# Patient Record
Sex: Female | Born: 1980 | Race: White | Hispanic: No | Marital: Single | Smoking: Never smoker
Health system: Southern US, Community
[De-identification: ages and names within clinical notes are randomized; demographics above are authoritative.]

## PROBLEM LIST (undated history)

## (undated) DIAGNOSIS — Z972 Presence of dental prosthetic device (complete) (partial): Secondary | ICD-10-CM

## (undated) DIAGNOSIS — I10 Essential (primary) hypertension: Secondary | ICD-10-CM

## (undated) DIAGNOSIS — F410 Panic disorder [episodic paroxysmal anxiety] without agoraphobia: Secondary | ICD-10-CM

## (undated) DIAGNOSIS — Z973 Presence of spectacles and contact lenses: Secondary | ICD-10-CM

## (undated) DIAGNOSIS — Z9889 Other specified postprocedural states: Secondary | ICD-10-CM

## (undated) DIAGNOSIS — R112 Nausea with vomiting, unspecified: Secondary | ICD-10-CM

## (undated) DIAGNOSIS — T753XXA Motion sickness, initial encounter: Secondary | ICD-10-CM

## (undated) DIAGNOSIS — F419 Anxiety disorder, unspecified: Secondary | ICD-10-CM

---
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
## 2004-03-27 ENCOUNTER — Inpatient Hospital Stay (HOSPITAL_COMMUNITY): Admission: AD | Admit: 2004-03-27 | Discharge: 2004-03-30 | Payer: Self-pay | Admitting: Gynecology

## 2006-12-20 ENCOUNTER — Emergency Department: Payer: Self-pay | Admitting: Emergency Medicine

## 2007-06-06 ENCOUNTER — Emergency Department: Payer: Self-pay | Admitting: Emergency Medicine

## 2007-11-17 ENCOUNTER — Emergency Department: Payer: Self-pay | Admitting: Emergency Medicine

## 2008-05-04 ENCOUNTER — Emergency Department: Payer: Self-pay | Admitting: Emergency Medicine

## 2008-07-03 ENCOUNTER — Ambulatory Visit: Payer: Self-pay | Admitting: Internal Medicine

## 2008-12-23 ENCOUNTER — Emergency Department: Payer: Self-pay | Admitting: Emergency Medicine

## 2011-02-17 ENCOUNTER — Emergency Department: Payer: Self-pay | Admitting: Unknown Physician Specialty

## 2011-06-05 ENCOUNTER — Emergency Department: Payer: Self-pay | Admitting: *Deleted

## 2011-06-24 ENCOUNTER — Emergency Department: Payer: Self-pay | Admitting: *Deleted

## 2011-08-19 ENCOUNTER — Emergency Department: Payer: Self-pay | Admitting: Internal Medicine

## 2012-05-02 ENCOUNTER — Emergency Department: Payer: Self-pay | Admitting: Emergency Medicine

## 2012-05-04 LAB — BETA STREP CULTURE(ARMC)

## 2012-10-05 ENCOUNTER — Emergency Department: Payer: Self-pay | Admitting: Emergency Medicine

## 2012-10-05 LAB — WET PREP, GENITAL

## 2012-10-05 LAB — URINALYSIS, COMPLETE
Bilirubin,UR: NEGATIVE
Ketone: NEGATIVE
Leukocyte Esterase: NEGATIVE
Nitrite: NEGATIVE
Ph: 7 (ref 4.5–8.0)
Protein: NEGATIVE
RBC,UR: NONE SEEN /HPF (ref 0–5)
WBC UR: <1 /HPF (ref 0–5)

## 2012-10-05 LAB — CBC
HCT: 38.5 % (ref 35.0–47.0)
HGB: 13.1 g/dL (ref 12.0–16.0)
MCH: 28.9 pg (ref 26.0–34.0)
MCHC: 34 g/dL (ref 32.0–36.0)
MCV: 85 fL (ref 80–100)
RBC: 4.52 10*6/uL (ref 3.80–5.20)
WBC: 8.1 10*3/uL (ref 3.6–11.0)

## 2012-10-05 LAB — HCG, QUANTITATIVE, PREGNANCY: Beta Hcg, Quant.: 3684 m[IU]/mL — ABNORMAL HIGH

## 2013-02-22 ENCOUNTER — Ambulatory Visit: Payer: Self-pay | Admitting: Physician Assistant

## 2013-05-05 ENCOUNTER — Ambulatory Visit: Payer: Self-pay | Admitting: Physician Assistant

## 2014-11-11 ENCOUNTER — Encounter: Payer: Self-pay | Admitting: Gynecology

## 2014-11-11 ENCOUNTER — Ambulatory Visit
Admission: EM | Admit: 2014-11-11 | Discharge: 2014-11-11 | Disposition: A | Discharge status: Home / Self Care | Payer: Medicaid Other | Attending: Family Medicine | Admitting: Family Medicine

## 2014-11-11 DIAGNOSIS — F419 Anxiety disorder, unspecified: Secondary | ICD-10-CM | POA: Insufficient documentation

## 2014-11-11 DIAGNOSIS — Z79899 Other long term (current) drug therapy: Secondary | ICD-10-CM | POA: Diagnosis not present

## 2014-11-11 DIAGNOSIS — J029 Acute pharyngitis, unspecified: Secondary | ICD-10-CM | POA: Diagnosis present

## 2014-11-11 DIAGNOSIS — J039 Acute tonsillitis, unspecified: Secondary | ICD-10-CM | POA: Diagnosis not present

## 2014-11-11 HISTORY — DX: Anxiety disorder, unspecified: F41.9

## 2014-11-11 LAB — RAPID STREP SCREEN (MED CTR MEBANE ONLY): STREPTOCOCCUS, GROUP A SCREEN (DIRECT): NEGATIVE

## 2014-11-11 MED ORDER — AZITHROMYCIN 250 MG PO TABS: 250.0000 mg | ORAL_TABLET | Freq: Every day | ORAL | Status: DC

## 2014-11-11 NOTE — ED Notes (Signed)
Patient c/o sore throat. 

## 2014-11-11 NOTE — Discharge Instructions (Signed)

## 2014-11-11 NOTE — ED Provider Notes (Addendum)
CSN: [id number]     Arrival date & time 11/11/14  1420 History   First MD Initiated Contact with Patient 11/11/14 1503     Chief Complaint  Patient presents with  . Sore Throat   (Consider location/radiation/quality/duration/timing/severity/associated sxs/prior Treatment) HPI Comments: Caucasian female; daughter sick with similar symptoms 89F fever today taking tylenol and motrin OTC alternating.  Sore throat x 4 days.  Clear to yellow now green nasal discharge.  Frontal headache.  Nephew with hand foot and mouth disease and children played together day prior to rash appeared.  Patient reported she has scrubbed pus off right tonsil with qtip.  Patient is a 34 y.o. female presenting with pharyngitis. The history is provided by the patient.  Sore Throat This is a new problem. The current episode started more than 2 days ago. The problem occurs constantly. The problem has been gradually worsening. Pertinent negatives include no chest pain, no abdominal pain, no headaches and no shortness of breath. The symptoms are aggravated by eating and swallowing. Nothing relieves the symptoms. She has tried acetaminophen, rest, food and water for the symptoms. The treatment provided mild relief.    Past Medical History  Diagnosis Date  . Anxiety    Past Surgical History  Procedure Laterality Date  . Cesarean section      x 2   History reviewed. No pertinent family history. History  Substance Use Topics  . Smoking status: Never Smoker   . Smokeless tobacco: Not on file  . Alcohol Use: No   OB History    No data available     Review of Systems  Constitutional: Positive for fever. Negative for chills, diaphoresis, activity change, appetite change and fatigue.  HENT: Positive for congestion, rhinorrhea, sinus pressure and sore throat. Negative for dental problem, drooling, ear discharge, ear pain, facial swelling, hearing loss, mouth sores, nosebleeds, postnasal drip, sneezing, tinnitus, trouble  swallowing and voice change.   Eyes: Negative for photophobia, pain, discharge, redness and itching.  Respiratory: Negative for cough, shortness of breath, wheezing and stridor.   Cardiovascular: Negative for chest pain, palpitations and leg swelling.  Gastrointestinal: Negative for nausea, vomiting, abdominal pain, diarrhea, constipation, blood in stool, abdominal distention, anal bleeding and rectal pain.  Endocrine: Negative for cold intolerance and heat intolerance.  Genitourinary: Negative for hematuria, flank pain and difficulty urinating.  Musculoskeletal: Negative for myalgias, back pain, joint swelling, arthralgias, gait problem, neck pain and neck stiffness.  Skin: Negative for color change, pallor, rash and wound.  Allergic/Immunologic: Negative for environmental allergies and food allergies.  Neurological: Negative for dizziness, tremors, syncope, facial asymmetry, speech difficulty, weakness, light-headedness, numbness and headaches.  Hematological: Negative for adenopathy. Does not bruise/bleed easily.  Psychiatric/Behavioral: Negative for behavioral problems, confusion, sleep disturbance and agitation.    Allergies  Amoxicillin and Penicillins  Home Medications   Prior to Admission medications   Medication Sig Start Date End Date Taking? Authorizing Provider  hydrochlorothiazide (HYDRODIURIL) 25 MG tablet Take 25 mg by mouth daily.   Yes Historical Provider, MD  PARoxetine (PAXIL) 30 MG tablet Take 30 mg by mouth daily.   Yes Historical Provider, MD  azithromycin (ZITHROMAX) 250 MG tablet Take 1 tablet (250 mg total) by mouth daily. Take first 2 tablets together, then 1 every day until finished. 11/11/14   Barbaraann Barthelina A Betancourt, NP   BP 126/91 mmHg  Pulse 88  Temp(Src) 97.4 F (36.3 C) (Tympanic)  Ht 5\' 4"  (1.626 m)  Wt 215 lb (97.523 kg)  BMI  36.89 kg/m2  SpO2 98%  LMP 11/11/2014 Physical Exam  Constitutional: She is oriented to person, place, and time. Vital signs are  normal. She appears well-developed and well-nourished. No distress.  HENT:  Head: Normocephalic and atraumatic.  Right Ear: Hearing, tympanic membrane, external ear and ear canal normal.  Left Ear: Hearing, tympanic membrane, external ear and ear canal normal.  Nose: Mucosal edema and rhinorrhea present. No nose lacerations, sinus tenderness or nasal deformity. No epistaxis. Right sinus exhibits no maxillary sinus tenderness and no frontal sinus tenderness. Left sinus exhibits no maxillary sinus tenderness and no frontal sinus tenderness.  Mouth/Throat: Uvula is midline and mucous membranes are normal. She does not have dentures. No oral lesions. No trismus in the jaw. Normal dentition. No dental abscesses, uvula swelling, lacerations or dental caries. Posterior oropharyngeal edema, posterior oropharyngeal erythema and tonsillar abscesses present. No oropharyngeal exudate.  Bilateral TMs with air fluid level clear; right tonsil with exudate bilateral tonsils with edema/erythema 2-3+/4 symmetrical; cobblestoning posterior pharynx  Eyes: Conjunctivae, EOM and lids are normal. Pupils are equal, round, and reactive to light. Right eye exhibits no discharge. Left eye exhibits no discharge. No scleral icterus.  Neck: Trachea normal and normal range of motion. Neck supple. No tracheal deviation present. No thyromegaly present.  Cardiovascular: Normal rate, regular rhythm, normal heart sounds and intact distal pulses.  Exam reveals no gallop and no friction rub.   No murmur heard. Pulmonary/Chest: Effort normal and breath sounds normal. No respiratory distress. She has no wheezes. She has no rales. She exhibits no tenderness.  Abdominal: Soft. Bowel sounds are normal. She exhibits no distension and no mass. There is no tenderness. There is no rebound and no guarding.  Musculoskeletal: Normal range of motion. She exhibits no edema or tenderness.  Lymphadenopathy:    She has no cervical adenopathy.   Neurological: She is alert and oriented to person, place, and time. Coordination normal.  Skin: Skin is warm, dry and intact. No rash noted. She is not diaphoretic. No erythema. No pallor.  Psychiatric: She has a normal mood and affect. Her speech is normal and behavior is normal. Judgment and thought content normal. Cognition and memory are normal.  Nursing note and vitals reviewed.   ED Course  Procedures (including critical care time) Labs Review Labs Reviewed  RAPID STREP SCREEN (NOT AT [facility])  CULTURE, GROUP A STREP ([facility] ONLY)    Imaging Review No results found.   MDM   1. Tonsillitis    Patient preferred po meds azithromycin as penicillin allergy.   Patient notified rapid strep negative and throat culture results will be available in 48 hours.  Exudate on tonsils will treat.  Offered dukes mouthwash patient did not want at this time.  For acute tonsillitis caused by bacteria, your healthcare provider will prescribe an antibiotic.  Marland Kitchen Do not smoke.  Marland Kitchen Avoid secondhand smoke and other air pollutants.  . Use a cool mist humidifier to add moisture to the air.  . Get plenty of rest.  . You may want to rest your throat by talking less and eating a diet that is mostly liquid or soft for a day or two.   Marland Kitchen Nonprescription throat lozenges and mouthwashes should help relieve the soreness.   . Gargling with warm saltwater and drinking warm liquids may help.  (You can make a saltwater solution by adding 1/4 teaspoon of salt to 8 ounces, or 240 mL, of warm water.)  . A nonprescription pain reliever such as  aspirin, acetaminophen, or ibuprofen may ease general aches and pains.    Discussed no sharing eating utensils or oral hygiene products.  Throw away toothbrush after one week and buy new.  No intimate oral contact x 1 week.  FOLLOW UP with clinic provider if no improvements in the next 7-10 days or worsening of symptoms, fever, dysphagia.  Patient verbalized understanding of  instructions, agreed with plan of care and had no further questions at this time.   P2:  Hand washing and diet.    Barbaraann Barthel, NP 11/11/14 4098  14 Nov 2014 1542 patient contacted via telephone and notified throat culture negative/normal.  Patient reported she is feeling better.  Patient verbalized understanding of information/instructions and had no further questions at this time.  Barbaraann Barthel, NP 11/14/14 [phone]

## 2014-11-14 LAB — CULTURE, GROUP A STREP (THRC)

## 2014-11-29 ENCOUNTER — Encounter: Payer: Self-pay | Admitting: Emergency Medicine

## 2014-11-29 ENCOUNTER — Ambulatory Visit
Admission: EM | Admit: 2014-11-29 | Discharge: 2014-11-29 | Disposition: A | Discharge status: Home / Self Care | Payer: Medicaid Other | Attending: Internal Medicine | Admitting: Internal Medicine

## 2014-11-29 DIAGNOSIS — M5431 Sciatica, right side: Secondary | ICD-10-CM | POA: Diagnosis not present

## 2014-11-29 MED ORDER — PREDNISONE 50 MG PO TABS: 50.0000 mg | ORAL_TABLET | Freq: Every day | ORAL | Status: AC

## 2014-11-29 NOTE — Discharge Instructions (Signed)
Pain in R hip/leg seems most likely to be something like sciatica. Continue ibuprofen as you are. Prescription for prednisone was sent to the [facility]. Ice maybe helpful with decreasing frequency of sharp pains. Recheck hip/leg pain, especially with knee flexion, at urgent care or with primary doctor in a week.

## 2014-11-29 NOTE — ED Notes (Signed)
Patient c/o right hip pain that goes down right leg.  Patient states that she fell on Monday.

## 2014-11-29 NOTE — ED Provider Notes (Signed)
CSN: [id number]     Arrival date & time 11/29/14  1313 History   First MD Initiated Contact with Patient 11/29/14 1356     Chief Complaint  Patient presents with  . Fall  . Hip Pain    right   HPI Patient is a 34 year old lady who was playing with her kids several days ago, tripped on a toy and started to fall. She caught herself awkwardly, and has had intermittent severe/sharp pain shooting down the outside of her right leg. No leg weakness or clumsiness, no change in bowel or bladder function, no loss of sensation. No back pain per se. She was able to walk into the urgent care independently. Sitting at rest, she has mild discomfort, maybe a 1 or 2. Changes in position trigger this shooting pain.  Past Medical History  Diagnosis Date  . Anxiety    Past Surgical History  Procedure Laterality Date  . Cesarean section      x 2   History reviewed. No pertinent family history. History  Substance Use Topics  . Smoking status: Never Smoker   . Smokeless tobacco: Not on file  . Alcohol Use: No   OB History    No data available     Review of Systems  All other systems reviewed and are negative.   Allergies  Amoxicillin and Penicillins  Home Medications   Prior to Admission medications   Medication Sig Start Date End Date Taking? Authorizing Provider  hydrochlorothiazide (HYDRODIURIL) 25 MG tablet Take 25 mg by mouth daily.    Historical Provider, MD  PARoxetine (PAXIL) 30 MG tablet Take 30 mg by mouth daily.    Historical Provider, MD          BP 127/77 mmHg  Pulse 75  Temp(Src) 98 F (36.7 C) (Tympanic)  Resp 16  Ht 5\' 4"  (1.626 m)  Wt 215 lb (97.523 kg)  BMI 36.89 kg/m2  SpO2 98%  LMP 11/11/2014 Physical Exam  Constitutional: She is oriented to person, place, and time. No distress.  Alert, nicely groomed  HENT:  Head: Atraumatic.  Eyes:  Conjugate gaze, no eye redness/drainage  Neck: Neck supple.  Cardiovascular: Normal rate.   Pulmonary/Chest: No  respiratory distress.  Abdominal: She exhibits no distension.  Musculoskeletal: Normal range of motion.       Legs: No leg swelling No tenderness to palpation in the low back, no spinal tenderness. No right trochanteric tenderness. Patient was able to walk into the urgent care independently, but changes in position particularly hip flexion and extension are slow with intermittent painfulness. Resisted knee flexion is particularly painful on the right.  Neurological: She is alert and oriented to person, place, and time.  Skin: Skin is warm and dry.  No cyanosis  Nursing note and vitals reviewed.   ED Course  Procedures   MDM   1. Sciatica neuralgia, right    continue ibuprofen. Prescription for a pulse of prednisone was sent to the [facility] pharmacy. Recheck hip/leg pain, especially with knee flexion, with PCP or the urgent care in a week. Consider imaging versus physical therapy depending on level of improvement.    Eustace Moore, MD 11/29/14 1420

## 2015-10-19 ENCOUNTER — Encounter: Payer: Self-pay | Admitting: Emergency Medicine

## 2015-10-19 ENCOUNTER — Emergency Department: Payer: Medicaid Other

## 2015-10-19 ENCOUNTER — Emergency Department
Admission: EM | Admit: 2015-10-19 | Discharge: 2015-10-19 | Disposition: A | Discharge status: Home / Self Care | Payer: Medicaid Other | Attending: Emergency Medicine | Admitting: Emergency Medicine

## 2015-10-19 DIAGNOSIS — F419 Anxiety disorder, unspecified: Secondary | ICD-10-CM | POA: Insufficient documentation

## 2015-10-19 DIAGNOSIS — I10 Essential (primary) hypertension: Secondary | ICD-10-CM | POA: Insufficient documentation

## 2015-10-19 DIAGNOSIS — Z880 Allergy status to penicillin: Secondary | ICD-10-CM | POA: Diagnosis not present

## 2015-10-19 DIAGNOSIS — R079 Chest pain, unspecified: Secondary | ICD-10-CM | POA: Diagnosis present

## 2015-10-19 HISTORY — DX: Essential (primary) hypertension: I10

## 2015-10-19 LAB — COMPREHENSIVE METABOLIC PANEL
ALK PHOS: 80 U/L (ref 38–126)
ALT: 9 U/L — AB (ref 14–54)
AST: 24 U/L (ref 15–41)
Albumin: 4 g/dL (ref 3.5–5.0)
Anion gap: 10 (ref 5–15)
BUN: 12 mg/dL (ref 6–20)
CALCIUM: 8.9 mg/dL (ref 8.9–10.3)
CHLORIDE: 98 mmol/L — AB (ref 101–111)
CO2: 28 mmol/L (ref 22–32)
CREATININE: 0.65 mg/dL (ref 0.44–1.00)
GFR calc Af Amer: >60 mL/min (ref >60)
Glucose, Bld: 116 mg/dL — ABNORMAL HIGH (ref 65–99)
Potassium: 2.8 mmol/L — CL (ref 3.5–5.1)
Sodium: 136 mmol/L (ref 135–145)
Total Bilirubin: <0.1 mg/dL — ABNORMAL LOW (ref 0.3–1.2)
Total Protein: 7.6 g/dL (ref 6.5–8.1)

## 2015-10-19 LAB — CBC
HCT: 35.2 % (ref 35.0–47.0)
Hemoglobin: 12.1 g/dL (ref 12.0–16.0)
MCH: 27.9 pg (ref 26.0–34.0)
MCHC: 34.3 g/dL (ref 32.0–36.0)
MCV: 81.3 fL (ref 80.0–100.0)
Platelets: 366 10*3/uL (ref 150–440)
RBC: 4.33 MIL/uL (ref 3.80–5.20)
RDW: 14.1 % (ref 11.5–14.5)
WBC: 10.7 10*3/uL (ref 3.6–11.0)

## 2015-10-19 LAB — TROPONIN I

## 2015-10-19 MED ORDER — POTASSIUM CHLORIDE ER 10 MEQ PO TBCR: 10.0000 meq | EXTENDED_RELEASE_TABLET | Freq: Every day | ORAL | Status: DC

## 2015-10-19 MED ORDER — LORAZEPAM 2 MG PO TABS
2.0000 mg | ORAL_TABLET | Freq: Once | ORAL | Status: AC
  Administered 2015-10-19: 2 mg via ORAL
  Filled 2015-10-19: qty 1

## 2015-10-19 MED ORDER — LORAZEPAM 1 MG PO TABS: 1.0000 mg | ORAL_TABLET | Freq: Two times a day (BID) | ORAL | Status: DC | PRN

## 2015-10-19 MED ORDER — POTASSIUM CHLORIDE CRYS ER 20 MEQ PO TBCR
20.0000 meq | EXTENDED_RELEASE_TABLET | Freq: Once | ORAL | Status: AC
  Administered 2015-10-19: 20 meq via ORAL
  Filled 2015-10-19: qty 1

## 2015-10-19 NOTE — ED Notes (Signed)
When questioned during triage has sensation of heart pounding. When holds one hand over each eye can see nurses hand well. PERL. Grips and leg strength equal.

## 2015-10-19 NOTE — ED Notes (Signed)
States has had chest pain x 20 minutes, feeling of heart pounding. States on way to ER noted intermittent L eye vision changes. Nausea and vomiting since this am.

## 2015-10-19 NOTE — ED Provider Notes (Signed)
Time Seen: Approximately 1630  I have reviewed the triage notes  Chief Complaint: Chest Pain   History of Present Illness: Monique Horne is a 35 y.o. female who presents with feelings of some heart palpitations. She states it feels like her heart is pounding. She states that she had some numbness on the left side her face and some nausea and vomited 1 no blood and no bile. She noticed some blurred vision in her left visual field she states that her heart has improved she denies any visual field deficits. She denies any trouble with speech or swallowing. She denies any weakness in either upper or lower extremities. No fever. She denies any current chest pain or abdominal pain.   Past Medical History  Diagnosis Date  . Anxiety   . Hypertension     There are no active problems to display for this patient.   Past Surgical History  Procedure Laterality Date  . Cesarean section      x 2    Past Surgical History  Procedure Laterality Date  . Cesarean section      x 2    Current Outpatient Rx  Name  Route  Sig  Dispense  Refill  . hydrochlorothiazide (HYDRODIURIL) 25 MG tablet   Oral   Take 25 mg by mouth daily.         Marland Kitchen. PARoxetine (PAXIL) 30 MG tablet   Oral   Take 30 mg by mouth daily.           Allergies:  Amoxicillin and Penicillins  Family History: No family history on file.  Social History: Social History  Substance Use Topics  . Smoking status: Never Smoker   . Smokeless tobacco: None  . Alcohol Use: No     Review of Systems:   10 point review of systems was performed and was otherwise negative:  Constitutional: No fever Eyes: No visual disturbances ENT: No sore throat, ear pain Cardiac: No chest pain Respiratory: No shortness of breath, wheezing, or stridor Abdomen: No abdominal pain, no vomiting, No diarrhea Endocrine: No weight loss, No night sweats Extremities: No peripheral edema, cyanosis Skin: No rashes, easy  bruising Neurologic: No focal weakness, trouble with speech or swollowing Urologic: No dysuria, Hematuria, or urinary frequency   Physical Exam:  ED Triage Vitals  Enc Vitals Group     BP 10/19/15 1429 128/86 mmHg     Pulse Rate 10/19/15 1429 69     Resp 10/19/15 1429 20     Temp 10/19/15 1429 98 F (36.7 C)     Temp Source 10/19/15 1429 Oral     SpO2 10/19/15 1429 100 %     Weight 10/19/15 1429 225 lb (102.059 kg)     Height 10/19/15 1429 5\' 4"  (1.626 m)     Head Cir --      Peak Flow --      Pain Score 10/19/15 1431 10     Pain Loc --      Pain Edu? --      Excl. in GC? --     General: Awake , Alert , and Oriented times 3; GCS 15 Head: Normal cephalic , atraumatic Eyes: Pupils equal , round, reactive to light. Extraocular eye movements are intact. Nose/Throat: No nasal drainage, patent upper airway without erythema or exudate.  Neck: Supple, Full range of motion, No anterior adenopathy or palpable thyroid masses Lungs: Clear to ascultation without wheezes , rhonchi, or rales Heart: Regular rate, regular  rhythm without murmurs , gallops , or rubs Abdomen: Soft, non tender without rebound, guarding , or rigidity; bowel sounds positive and symmetric in all 4 quadrants. No organomegaly .        Extremities: 2 plus symmetric pulses. No edema, clubbing or cyanosis Neurologic: normal ambulation, Motor symmetric without deficits, sensory intact Skin: warm, dry, no rashes   Labs:   All laboratory work was reviewed including any pertinent negatives or positives listed below:  Labs Reviewed  COMPREHENSIVE METABOLIC PANEL - Abnormal; Notable for the following:    Potassium 2.8 (*)    Chloride 98 (*)    Glucose, Bld 116 (*)    ALT 9 (*)    Total Bilirubin <0.1 (*)    All other components within normal limits  CBC  TROPONIN I  Reviewed the patient's laboratory work shows some hypokalemia  EKG:  ED ECG REPORT I, Jennye Moccasin, the attending physician, personally viewed  and interpreted this ECG.  Date: 10/19/2015 EKG Time: 1447 Rate: 70 Rhythm: normal sinus rhythm with sinus arrhythmia QRS Axis: normal Intervals: normal ST/T Wave abnormalities: normal Conduction Disturbances: none Narrative Interpretation: unremarkable Normal EKG Normal QT interval   Radiology:   Final result by Rad Results In Interface (10/19/15 17:21:05)   Narrative:   CLINICAL DATA: Left-sided chest pain for 20 minutes, initial encounter  EXAM: CHEST 2 VIEW  COMPARISON: 05/04/2008  FINDINGS: The heart size and mediastinal contours are within normal limits. Both lungs are clear. The visualized skeletal structures are unremarkable.  IMPRESSION: No active cardiopulmonary disease.   Electronically Signed By: Alcide Clever M.D. On: 10/19/2015 17:21        I personally reviewed the radiologic studies   ED Course: * The patient was placed on a continuous cardiac monitor remained in normal sinus rhythm with no ectopy. Patient felt symptomatically improved after oral Ativan will be discharged on the same. She was advised to decrease her dosage of her antidepressant which may have caused some mild side effects or this was a separate anxiety attack. She is not suicidal, homicidal or having any hallucinations at this time. Patient was advised to follow up for all complaints on Monday with her primary physician or return here if she has any new concerns. T interval is normal and I felt this was unlikely to be some form of acute arrhythmia.    Assessment:  Panic attack      Plan:  Outpatient Patient was advised to return immediately if condition worsens. Patient was advised to follow up with their primary care physician or other specialized physicians involved in their outpatient care. The patient and/or family member/power of attorney had laboratory results reviewed at the bedside. All questions and concerns were addressed and appropriate discharge  instructions were distributed by the nursing staff.             Jennye Moccasin, MD 10/19/15 [phone]

## 2015-10-19 NOTE — ED Notes (Signed)
Patient denies blurring vision, chest pain or facial numbness at this time.

## 2015-10-19 NOTE — Discharge Instructions (Signed)
Panic Attacks °Panic attacks are sudden, short-lived surges of severe anxiety, fear, or discomfort. They may occur for no reason when you are relaxed, when you are anxious, or when you are sleeping. Panic attacks may occur for a number of reasons:  °· Healthy people occasionally have panic attacks in extreme, life-threatening situations, such as war or natural disasters. Normal anxiety is a protective mechanism of the body that helps us react to danger (fight or flight response). °· Panic attacks are often seen with anxiety disorders, such as panic disorder, social anxiety disorder, generalized anxiety disorder, and phobias. Anxiety disorders cause excessive or uncontrollable anxiety. They may interfere with your relationships or other life activities. °· Panic attacks are sometimes seen with other mental illnesses, such as depression and posttraumatic stress disorder. °· Certain medical conditions, prescription medicines, and drugs of abuse can cause panic attacks. °SYMPTOMS  °Panic attacks start suddenly, peak within 20 minutes, and are accompanied by four or more of the following symptoms: °· Pounding heart or fast heart rate (palpitations). °· Sweating. °· Trembling or shaking. °· Shortness of breath or feeling smothered. °· Feeling choked. °· Chest pain or discomfort. °· Nausea or strange feeling in your stomach. °· Dizziness, light-headedness, or feeling like you will faint. °· Chills or hot flushes. °· Numbness or tingling in your lips or hands and feet. °· Feeling that things are not real or feeling that you are not yourself. °· Fear of losing control or going crazy. °· Fear of dying. °Some of these symptoms can mimic serious medical conditions. For example, you may think you are having a heart attack. Although panic attacks can be very scary, they are not life threatening. °DIAGNOSIS  °Panic attacks are diagnosed through an assessment by your health care provider. Your health care provider will ask  questions about your symptoms, such as where and when they occurred. Your health care provider will also ask about your medical history and use of alcohol and drugs, including prescription medicines. Your health care provider may order blood tests or other studies to rule out a serious medical condition. Your health care provider may refer you to a mental health professional for further evaluation. °TREATMENT  °· Most healthy people who have one or two panic attacks in an extreme, life-threatening situation will not require treatment. °· The treatment for panic attacks associated with anxiety disorders or other mental illness typically involves counseling with a mental health professional, medicine, or a combination of both. Your health care provider will help determine what treatment is best for you. °· Panic attacks due to physical illness usually go away with treatment of the illness. If prescription medicine is causing panic attacks, talk with your health care provider about stopping the medicine, decreasing the dose, or substituting another medicine. °· Panic attacks due to alcohol or drug abuse go away with abstinence. Some adults need professional help in order to stop drinking or using drugs. °HOME CARE INSTRUCTIONS  °· Take all medicines as directed by your health care provider.   °· Schedule and attend follow-up visits as directed by your health care provider. It is important to keep all your appointments. °SEEK MEDICAL CARE IF: °· You are not able to take your medicines as prescribed. °· Your symptoms do not improve or get worse. °SEEK IMMEDIATE MEDICAL CARE IF:  °· You experience panic attack symptoms that are different than your usual symptoms. °· You have serious thoughts about hurting yourself or others. °· You are taking medicine for panic attacks and   have a serious side effect. MAKE SURE YOU:  Understand these instructions.  Will watch your condition.  Will get help right away if you are not  doing well or get worse.   This information is not intended to replace advice given to you by your health care provider. Make sure you discuss any questions you have with your health care provider.   Document Released: 05/25/2005 Document Revised: 05/30/2013 Document Reviewed: 01/06/2013 Elsevier Interactive Patient Education [employer]2016 [employer]  Please return immediately if condition worsens. Please contact her primary physician or the physician you were given for referral. If you have any specialist physicians involved in her treatment and plan please also contact them. Thank you for using [facility]. Contact her primary physician and go back to the original dosage on your antidepressant medication. Only use the Ativan as needed

## 2015-11-19 ENCOUNTER — Encounter: Payer: Self-pay | Admitting: Obstetrics and Gynecology

## 2015-11-25 ENCOUNTER — Other Ambulatory Visit: Payer: Medicaid Other

## 2015-11-25 ENCOUNTER — Encounter: Payer: Self-pay | Admitting: *Deleted

## 2015-11-25 NOTE — Pre-Procedure Instructions (Signed)
MEDICAL CLEARANCE/ED NOTE/EKG FROM 10/19/15,AS INSTRUCTED BY DR [email or web address] CALLED AND FAXED TO DR Evalee MuttonJAMES [email or web address].LM ON HIS NURSE'S LINE. ALSO NOTIFIED NANCY AT DR Edison PaceJACKSON'S

## 2015-11-25 NOTE — Patient Instructions (Signed)
  Your procedure is scheduled on:11/28/15 Report to Day Surgery. [facility] To find out your arrival time please call [phone] between 1PM - 3PM on  11/27/15  Remember: Instructions that are not followed completely may result in serious medical risk, up to and including death, or upon the discretion of your surgeon and anesthesiologist your surgery may need to be rescheduled.    __X__ 1. Do not eat food or drink liquids after midnight. No gum chewing or hard candies.     __X__ 2. No Alcohol for 24 hours before or after surgery.   ____ 3. Do Not Smoke For 24 Hours Prior to Your Surgery.   ____ 4. Bring all medications with you on the day of surgery if instructed.    __X__ 5. Notify your doctor if there is any change in your medical condition     (cold, fever, infections).       Do not wear jewelry, make-up, hairpins, clips or nail polish.  Do not wear lotions, powders, or perfumes. You may wear deodorant.  Do not shave 48 hours prior to surgery. Men may shave face and neck.  Do not bring valuables to the hospital.    [facility] any belongings or valuables.               Contacts, dentures or bridgework may not be worn into surgery.  Leave your suitcase in the car. After surgery it may be brought to your room.  For patients admitted to the hospital, discharge time is determined by your                treatment team.   Patients discharged the day of surgery will not be allowed to drive home.   Please read over the following fact sheets that you were given:   Surgical Site Infection Prevention   ____ Take these medicines the morning of surgery with A SIP OF WATER:    1. NONE  2.   3.   4.  5.  6.  ____ Fleet Enema (as directed)   ____ Use CHG Soap as directed  ____ Use inhalers on the day of surgery  ____ Stop metformin 2 days prior to surgery    ____ Take 1/2 of usual insulin dose the night before surgery and none on the  morning of surgery.   ____ Stop Coumadin/Plavix/aspirin on   ____ Stop Anti-inflammatories on    ____ Stop supplements until after surgery.    ____ Bring C-Pap to the hospital.

## 2015-11-26 ENCOUNTER — Encounter
Admission: RE | Admit: 2015-11-26 | Discharge: 2015-11-26 | Disposition: A | Discharge status: Home / Self Care | Payer: Medicaid Other | Source: Ambulatory Visit | Attending: Obstetrics and Gynecology | Admitting: Obstetrics and Gynecology

## 2015-11-26 DIAGNOSIS — Z302 Encounter for sterilization: Secondary | ICD-10-CM | POA: Diagnosis present

## 2015-11-26 DIAGNOSIS — Z01812 Encounter for preprocedural laboratory examination: Secondary | ICD-10-CM | POA: Diagnosis not present

## 2015-11-26 LAB — TYPE AND SCREEN
ABO/RH(D): A POS
ANTIBODY SCREEN: NEGATIVE

## 2015-11-26 LAB — COMPREHENSIVE METABOLIC PANEL
ALT: 11 U/L — AB (ref 14–54)
AST: 31 U/L (ref 15–41)
Albumin: 4.1 g/dL (ref 3.5–5.0)
Alkaline Phosphatase: 93 U/L (ref 38–126)
Anion gap: 9 (ref 5–15)
BUN: 12 mg/dL (ref 6–20)
CHLORIDE: 104 mmol/L (ref 101–111)
CO2: 24 mmol/L (ref 22–32)
CREATININE: 0.71 mg/dL (ref 0.44–1.00)
Calcium: 9.3 mg/dL (ref 8.9–10.3)
Glucose, Bld: 91 mg/dL (ref 65–99)
POTASSIUM: 3.6 mmol/L (ref 3.5–5.1)
SODIUM: 137 mmol/L (ref 135–145)
Total Bilirubin: 0.5 mg/dL (ref 0.3–1.2)
Total Protein: 8 g/dL (ref 6.5–8.1)

## 2015-11-26 LAB — CBC
HCT: 37.4 % (ref 35.0–47.0)
Hemoglobin: 12.6 g/dL (ref 12.0–16.0)
MCH: 27.9 pg (ref 26.0–34.0)
MCHC: 33.6 g/dL (ref 32.0–36.0)
MCV: 82.8 fL (ref 80.0–100.0)
PLATELETS: 332 10*3/uL (ref 150–440)
RBC: 4.52 MIL/uL (ref 3.80–5.20)
RDW: 14.7 % — AB (ref 11.5–14.5)
WBC: 10.1 10*3/uL (ref 3.6–11.0)

## 2015-11-28 ENCOUNTER — Ambulatory Visit: Payer: Medicaid Other | Admitting: Certified Registered Nurse Anesthetist

## 2015-11-28 ENCOUNTER — Encounter
Admission: RE | Disposition: A | Discharge status: Home / Self Care | Payer: Self-pay | Source: Ambulatory Visit | Attending: Obstetrics and Gynecology

## 2015-11-28 ENCOUNTER — Ambulatory Visit
Admission: RE | Admit: 2015-11-28 | Discharge: 2015-11-28 | Disposition: A | Discharge status: Home / Self Care | Payer: Medicaid Other | Source: Ambulatory Visit | Attending: Obstetrics and Gynecology | Admitting: Obstetrics and Gynecology

## 2015-11-28 ENCOUNTER — Encounter: Payer: Self-pay | Admitting: *Deleted

## 2015-11-28 DIAGNOSIS — Z302 Encounter for sterilization: Secondary | ICD-10-CM | POA: Diagnosis present

## 2015-11-28 DIAGNOSIS — Z79899 Other long term (current) drug therapy: Secondary | ICD-10-CM | POA: Insufficient documentation

## 2015-11-28 DIAGNOSIS — F410 Panic disorder [episodic paroxysmal anxiety] without agoraphobia: Secondary | ICD-10-CM | POA: Insufficient documentation

## 2015-11-28 DIAGNOSIS — E669 Obesity, unspecified: Secondary | ICD-10-CM | POA: Diagnosis not present

## 2015-11-28 DIAGNOSIS — I10 Essential (primary) hypertension: Secondary | ICD-10-CM | POA: Diagnosis not present

## 2015-11-28 DIAGNOSIS — Z6838 Body mass index (BMI) 38.0-38.9, adult: Secondary | ICD-10-CM | POA: Diagnosis not present

## 2015-11-28 HISTORY — DX: Panic disorder (episodic paroxysmal anxiety): F41.0

## 2015-11-28 HISTORY — PX: LAPAROSCOPIC TUBAL LIGATION: SHX1937

## 2015-11-28 LAB — POCT PREGNANCY, URINE: Preg Test, Ur: NEGATIVE

## 2015-11-28 SURGERY — LIGATION, FALLOPIAN TUBE, LAPAROSCOPIC
Anesthesia: General | Laterality: Bilateral

## 2015-11-28 MED ORDER — FENTANYL CITRATE (PF) 100 MCG/2ML IJ SOLN
INTRAMUSCULAR | Status: DC | PRN
  Administered 2015-11-28 (×2): 50 ug via INTRAVENOUS
  Administered 2015-11-28: 100 ug via INTRAVENOUS
  Administered 2015-11-28: 50 ug via INTRAVENOUS

## 2015-11-28 MED ORDER — FAMOTIDINE 20 MG PO TABS
ORAL_TABLET | ORAL | Status: AC
  Administered 2015-11-28: 20 mg via ORAL
  Filled 2015-11-28: qty 1

## 2015-11-28 MED ORDER — ONDANSETRON HCL 4 MG/2ML IJ SOLN
4.0000 mg | Freq: Once | INTRAMUSCULAR | Status: AC | PRN
  Administered 2015-11-28: 4 mg via INTRAVENOUS

## 2015-11-28 MED ORDER — DEXMEDETOMIDINE HCL 200 MCG/2ML IV SOLN
INTRAVENOUS | Status: DC | PRN
  Administered 2015-11-28: 12 ug via INTRAVENOUS

## 2015-11-28 MED ORDER — ACETAMINOPHEN 10 MG/ML IV SOLN
INTRAVENOUS | Status: DC | PRN
  Administered 2015-11-28: 1000 mg via INTRAVENOUS

## 2015-11-28 MED ORDER — ONDANSETRON HCL 4 MG/2ML IJ SOLN
INTRAMUSCULAR | Status: DC | PRN
  Administered 2015-11-28: 4 mg via INTRAVENOUS

## 2015-11-28 MED ORDER — ONDANSETRON HCL 4 MG/2ML IJ SOLN
INTRAMUSCULAR | Status: AC
  Filled 2015-11-28: qty 2

## 2015-11-28 MED ORDER — FENTANYL CITRATE (PF) 100 MCG/2ML IJ SOLN
25.0000 ug | INTRAMUSCULAR | Status: DC | PRN
  Administered 2015-11-28 (×3): 25 ug via INTRAVENOUS

## 2015-11-28 MED ORDER — ROCURONIUM BROMIDE 100 MG/10ML IV SOLN
INTRAVENOUS | Status: DC | PRN
  Administered 2015-11-28: 10 mg via INTRAVENOUS
  Administered 2015-11-28: 20 mg via INTRAVENOUS

## 2015-11-28 MED ORDER — SUCCINYLCHOLINE CHLORIDE 20 MG/ML IJ SOLN
INTRAMUSCULAR | Status: DC | PRN
  Administered 2015-11-28: 100 mg via INTRAVENOUS

## 2015-11-28 MED ORDER — FENTANYL CITRATE (PF) 100 MCG/2ML IJ SOLN
INTRAMUSCULAR | Status: AC
  Filled 2015-11-28: qty 2

## 2015-11-28 MED ORDER — HYDROCODONE-ACETAMINOPHEN 5-325 MG PO TABS: 1.0000 | ORAL_TABLET | Freq: Four times a day (QID) | ORAL | Status: DC | PRN

## 2015-11-28 MED ORDER — BUPIVACAINE-EPINEPHRINE (PF) 0.25% -1:200000 IJ SOLN
INTRAMUSCULAR | Status: DC | PRN
  Administered 2015-11-28: 6 mL

## 2015-11-28 MED ORDER — MIDAZOLAM HCL 2 MG/2ML IJ SOLN
INTRAMUSCULAR | Status: DC | PRN
  Administered 2015-11-28: 2 mg via INTRAVENOUS

## 2015-11-28 MED ORDER — LIDOCAINE HCL (CARDIAC) 20 MG/ML IV SOLN
INTRAVENOUS | Status: DC | PRN
  Administered 2015-11-28: 100 mg via INTRAVENOUS

## 2015-11-28 MED ORDER — FAMOTIDINE 20 MG PO TABS
20.0000 mg | ORAL_TABLET | Freq: Once | ORAL | Status: AC
  Administered 2015-11-28: 20 mg via ORAL

## 2015-11-28 MED ORDER — IBUPROFEN 600 MG PO TABS: 600.0000 mg | ORAL_TABLET | Freq: Four times a day (QID) | ORAL | Status: DC | PRN

## 2015-11-28 MED ORDER — BUPIVACAINE-EPINEPHRINE (PF) 0.25% -1:200000 IJ SOLN
INTRAMUSCULAR | Status: AC
  Filled 2015-11-28: qty 30

## 2015-11-28 MED ORDER — DEXAMETHASONE SODIUM PHOSPHATE 10 MG/ML IJ SOLN
INTRAMUSCULAR | Status: DC | PRN
  Administered 2015-11-28: 10 mg via INTRAVENOUS

## 2015-11-28 MED ORDER — SUGAMMADEX SODIUM 500 MG/5ML IV SOLN
INTRAVENOUS | Status: DC | PRN
  Administered 2015-11-28: 204.2 mg via INTRAVENOUS

## 2015-11-28 MED ORDER — LACTATED RINGERS IV SOLN: INTRAVENOUS | Status: DC

## 2015-11-28 MED ORDER — LACTATED RINGERS IV SOLN
INTRAVENOUS | Status: DC
  Administered 2015-11-28: 09:00:00 via INTRAVENOUS

## 2015-11-28 MED ORDER — PROPOFOL 10 MG/ML IV BOLUS
INTRAVENOUS | Status: DC | PRN
  Administered 2015-11-28: 200 mg via INTRAVENOUS

## 2015-11-28 MED ORDER — EPHEDRINE SULFATE 50 MG/ML IJ SOLN
INTRAMUSCULAR | Status: DC | PRN
  Administered 2015-11-28: 5 mg via INTRAVENOUS
  Administered 2015-11-28: 10 mg via INTRAVENOUS

## 2015-11-28 MED ORDER — KETOROLAC TROMETHAMINE 30 MG/ML IJ SOLN
INTRAMUSCULAR | Status: DC | PRN
  Administered 2015-11-28: 30 mg via INTRAVENOUS

## 2015-11-28 SURGICAL SUPPLY — 33 items
BLADE SURG SZ11 CARB STEEL (BLADE) ×3 IMPLANT
CATH FOLEY 2WAY  5CC 16FR (CATHETERS) ×2
CATH FOLEY 2WAY 5CC 16FR (CATHETERS) ×1
CATH ROBINSON RED A/P 16FR (CATHETERS) ×3 IMPLANT
CATH URTH 16FR FL 2W BLN LF (CATHETERS) ×1 IMPLANT
CHLORAPREP W/TINT 26ML (MISCELLANEOUS) ×3 IMPLANT
CLIP FILSHIE TUBAL LIGA STRL (Clip) ×4 IMPLANT
CORD MONOPOLAR M/FML 12FT (MISCELLANEOUS) ×2 IMPLANT
DRAPE LAPAROTOMY 100X77 ABD (DRAPES) ×3 IMPLANT
DRAPE LEGGINS SURG 28X43 STRL (DRAPES) ×3 IMPLANT
GLOVE BIO SURGEON STRL SZ7 (GLOVE) ×8 IMPLANT
GLOVE BIOGEL PI IND STRL 7.5 (GLOVE) ×1 IMPLANT
GLOVE BIOGEL PI INDICATOR 7.5 (GLOVE) ×6
GOWN STRL REUS W/ TWL LRG LVL3 (GOWN DISPOSABLE) ×2 IMPLANT
GOWN STRL REUS W/TWL LRG LVL3 (GOWN DISPOSABLE) ×6
IRRIGATION STRYKERFLOW (MISCELLANEOUS) IMPLANT
IRRIGATOR STRYKERFLOW (MISCELLANEOUS) ×3
IV LACTATED RINGERS 1000ML (IV SOLUTION) ×2 IMPLANT
KIT RM TURNOVER CYSTO AR (KITS) ×3 IMPLANT
LABEL OR SOLS (LABEL) ×3 IMPLANT
LIQUID BAND (GAUZE/BANDAGES/DRESSINGS) ×3 IMPLANT
NS IRRIG 500ML POUR BTL (IV SOLUTION) ×3 IMPLANT
PACK GYN LAPAROSCOPIC (MISCELLANEOUS) ×3 IMPLANT
PAD OB MATERNITY 4.3X12.25 (PERSONAL CARE ITEMS) ×3 IMPLANT
PAD PREP 24X41 OB/GYN DISP (PERSONAL CARE ITEMS) ×3 IMPLANT
SCISSORS METZENBAUM CVD 33 (INSTRUMENTS) ×2 IMPLANT
SLEEVE ENDOPATH XCEL 5M (ENDOMECHANICALS) ×2 IMPLANT
SUT MNCRL 3-0 UNDYED SH (SUTURE) ×1 IMPLANT
SUT MONOCRYL 3-0 UNDYED (SUTURE) ×2
SYRINGE 10CC LL (SYRINGE) ×3 IMPLANT
TROCAR ENDO BLADELESS 11MM (ENDOMECHANICALS) ×2 IMPLANT
TROCAR XCEL NON-BLD 5MMX100MML (ENDOMECHANICALS) ×3 IMPLANT
TUBING INSUFFLATOR HI FLOW (MISCELLANEOUS) ×3 IMPLANT

## 2015-11-28 NOTE — Discharge Instructions (Signed)

## 2015-11-28 NOTE — Transfer of Care (Signed)
Immediate Anesthesia Transfer of Care Note  Patient: Monique Horne  Procedure(s) Performed: Procedure(s): LAPAROSCOPIC TUBAL LIGATION (Bilateral)  Patient Location: PACU  Anesthesia Type:General  Level of Consciousness: awake, alert  and oriented  Airway & Oxygen Therapy: Patient Spontanous Breathing and Patient connected to face mask oxygen  Post-op Assessment: Report given to RN and Post -op Vital signs reviewed and stable  Post vital signs: Reviewed and stable  Last Vitals:  Filed Vitals:   11/28/15 0905  BP: 130/98  Pulse: 73  Temp: 37.3 C  Resp: 18    Last Pain: There were no vitals filed for this visit.       Complications: No apparent anesthesia complications

## 2015-11-28 NOTE — Op Note (Signed)
Operative Report  Pre-Op Diagnosis:  multiparity, desires permanent sterility  Post-Op Diagnosis: multiparity, desires permanent sterility  Procedures:  1. Laparoscopic bilateral tubal ligation with Filshie clips 2. Lysis of adhesions  Primary Surgeon: Thomasene MohairStephen Cresencio Reesor, MD   EBL: 50 ml   IVF: 700 mL   Urine output: 50 mL  Specimens: None  Drains: None  Complications: None   Disposition: PACU   Condition: Stable   Findings:  1) significant adhesions of omentum and anterior uterus to anterior abdominal wall 2) adhesion of ovaries and fallopian tubes to pelvic sidewall   Procedure Summary:  The patient was taken to the operating room where general anesthesia was administered and found to be adequate. She was placed in the dorsal supine lithotomy position in [place] stirrups and prepped and draped in usual sterile fashion. After a timeout was called an indwelling catheter was placed in her bladder. A sponge stick was placed in the vagina for uterine manipulation.   Attention was turned to the abdomen where after injection of local anesthetic, a 5 mm infraumbilical incision was made with the scalpel. Entry into the abdomen was obtained via Optiview trocar technique (a blunt entry technique with camera visualization through the obturator upon entry). Verification of entry into the abdomen was obtained using opening pressures. The abdomen was insufflated with CO2. The camera was introduced through the trocar with verification of atraumatic entry.  The pelvis was evaluated with the above noted findings.    Patient placed in Trendelenburg positioning.  11 mm left lower quadrant incision made and accessory port advanced under direct visualization.    Lysis of adhesions was undertaken to free the fallopian tubes. This occurred without incident. Filshie applicator was advanced through the port and left fallopian tube elevated.  Filshie clip applied at the proximal tube with care to  occlude the entire diameter of the tube.  Filshie clip applied without incident.  In a similar fashion, the contralateral tube was identified, grasped with Filshie applicator and clip was applied to the proximal tube without incident.    Bilateral sites were visualized and complete occlusion confirmed.  All instruments were removed, abdomen deflated, and skin closed with skin glue with the left lower quadrant skin incision primarily closed with 4-0 monocryl in a subcuticular fashion.   Sponge, lap, needle, and instrument counts were correct x 2.  VTE prophylaxis: SCDs. Antibiotic prophylaxis: none indicated. The patient tolerated the procedure well and was taken to the PACU in stable condition.   Thomasene MohairStephen Joshuajames Moehring, MD 11/28/2015 11:21 AM

## 2015-11-28 NOTE — Anesthesia Procedure Notes (Signed)
Procedure Name: Intubation Performed by: Edd Reppert Pre-anesthesia Checklist: Patient identified, Patient being monitored, Timeout performed, Emergency Drugs available and Suction available Patient Re-evaluated:Patient Re-evaluated prior to inductionOxygen Delivery Method: Circle system utilized Preoxygenation: Pre-oxygenation with 100% oxygen Intubation Type: IV induction Ventilation: Mask ventilation without difficulty Laryngoscope Size: Mac and 3 Grade View: Grade I Tube type: Oral Tube size: 7.0 mm Number of attempts: 1 Airway Equipment and Method: Stylet Placement Confirmation: ETT inserted through vocal cords under direct vision,  positive ETCO2 and breath sounds checked- equal and bilateral Secured at: 21 cm Tube secured with: Tape Dental Injury: Teeth and Oropharynx as per pre-operative assessment        

## 2015-11-28 NOTE — H&P (Signed)
History and Physical Interval Note:  Monique Horne  has presented today for surgery, with the diagnosis of DESIRES PERMANENT STERILIZATION  The various methods of treatment have been discussed with the patient and family. After consideration of risks, benefits and other options for treatment, the patient has consented to  Procedure(s): LAPAROSCOPIC TUBAL LIGATION (Bilateral) as a surgical intervention .  The patient's history has been reviewed, patient examined, no change in status, stable for surgery.  I have reviewed the patient's chart and labs.  Questions were answered to the patient's satisfaction.    Conard NovakJackson, Iriana Artley D, MD 11/28/2015 9:46 AM

## 2015-11-28 NOTE — Anesthesia Preprocedure Evaluation (Signed)
Anesthesia Evaluation  Patient identified by MRN, date of birth, ID band Patient awake    Reviewed: Allergy & Precautions, NPO status , Patient's Chart, lab work & pertinent test results  Airway Mallampati: II  TM Distance: >3 FB    Comment: Large neck Dental  (+) Chipped   Pulmonary neg pulmonary ROS,    Pulmonary exam normal        Cardiovascular hypertension, Pt. on medications Normal cardiovascular exam     Neuro/Psych Anxiety Panic attacksnegative neurological ROS     GI/Hepatic negative GI ROS, Neg liver ROS,   Endo/Other  negative endocrine ROS  Renal/GU negative Renal ROS  negative genitourinary   Musculoskeletal negative musculoskeletal ROS (+)   Abdominal (+) + obese,   Peds negative pediatric ROS (+)  Hematology negative hematology ROS (+)   Anesthesia Other Findings   Reproductive/Obstetrics                             Anesthesia Physical Anesthesia Plan  ASA: II  Anesthesia Plan: General   Post-op Pain Management:    Induction: Intravenous and Rapid sequence  Airway Management Planned: Oral ETT  Additional Equipment:   Intra-op Plan:   Post-operative Plan: Extubation in OR  Informed Consent: I have reviewed the patients History and Physical, chart, labs and discussed the procedure including the risks, benefits and alternatives for the proposed anesthesia with the patient or authorized representative who has indicated his/her understanding and acceptance.   Dental advisory given  Plan Discussed with: CRNA and Surgeon  Anesthesia Plan Comments:         Anesthesia Quick Evaluation

## 2015-11-29 NOTE — Anesthesia Postprocedure Evaluation (Signed)
Anesthesia Post Note  Patient: Monique Horne  Procedure(s) Performed: Procedure(s) (LRB): LAPAROSCOPIC TUBAL LIGATION (Bilateral)  Patient location during evaluation: PACU Anesthesia Type: General Level of consciousness: awake and alert and oriented Pain management: pain level controlled Vital Signs Assessment: post-procedure vital signs reviewed and stable Respiratory status: spontaneous breathing Cardiovascular status: blood pressure returned to baseline Anesthetic complications: no    Last Vitals:  Filed Vitals:   11/28/15 1220 11/28/15 1254  BP: 127/76 117/71  Pulse: 70 59  Temp: 37.1 C   Resp: 16 18    Last Pain:  Filed Vitals:   11/28/15 1256  PainSc: 2                  Herbie Lehrmann

## 2015-12-29 ENCOUNTER — Encounter: Payer: Self-pay | Admitting: Emergency Medicine

## 2015-12-29 ENCOUNTER — Ambulatory Visit
Admission: EM | Admit: 2015-12-29 | Discharge: 2015-12-29 | Disposition: A | Discharge status: Home / Self Care | Payer: Medicaid Other | Attending: Family Medicine | Admitting: Family Medicine

## 2015-12-29 DIAGNOSIS — F419 Anxiety disorder, unspecified: Secondary | ICD-10-CM | POA: Diagnosis not present

## 2015-12-29 DIAGNOSIS — I10 Essential (primary) hypertension: Secondary | ICD-10-CM | POA: Diagnosis not present

## 2015-12-29 DIAGNOSIS — N390 Urinary tract infection, site not specified: Secondary | ICD-10-CM | POA: Diagnosis not present

## 2015-12-29 DIAGNOSIS — R300 Dysuria: Secondary | ICD-10-CM | POA: Diagnosis present

## 2015-12-29 LAB — URINALYSIS COMPLETE WITH MICROSCOPIC (ARMC ONLY)
GLUCOSE, UA: NEGATIVE mg/dL
KETONES UR: NEGATIVE mg/dL
Nitrite: NEGATIVE
SPECIFIC GRAVITY, URINE: 1.02 (ref 1.005–1.030)
pH: 7.5 (ref 5.0–8.0)

## 2015-12-29 LAB — PREGNANCY, URINE: Preg Test, Ur: NEGATIVE

## 2015-12-29 MED ORDER — PHENAZOPYRIDINE HCL 100 MG PO TABS: 200.0000 mg | ORAL_TABLET | Freq: Three times a day (TID) | ORAL | 0 refills | Status: DC | PRN

## 2015-12-29 MED ORDER — FLUCONAZOLE 150 MG PO TABS: 150.0000 mg | ORAL_TABLET | Freq: Once | ORAL | 0 refills | Status: AC

## 2015-12-29 MED ORDER — SULFAMETHOXAZOLE-TRIMETHOPRIM 800-160 MG PO TABS: 1.0000 | ORAL_TABLET | Freq: Two times a day (BID) | ORAL | 0 refills | Status: AC

## 2015-12-29 NOTE — Discharge Instructions (Signed)
Take medication as prescribed. Rest. Drink plenty of fluids.   Follow up with your primary care physician this week. Return to Urgent care or ER for fever, pain, inability to eat or drink, for new or worsening concerns.

## 2015-12-29 NOTE — ED Triage Notes (Signed)
Patient c/o burning when urinating, increase in urinary urgency that started 3 days ago.  Patient reports 102 fever this morning.

## 2015-12-29 NOTE — ED Provider Notes (Signed)
[facility]  ____________________________________________  Time seen: Approximately 9:29 AM  I have reviewed the triage vital signs and the nursing notes.   HISTORY  Chief Complaint Dysuria   HPI Monique Horne is a 35 y.o. female presents for complaints of 3 days of urinary frequency, urinary urgency and burning with urination. Patient reports some associated low back pain, which is unchanged by movement. States mild suprapubic pressure but otherwise denies abdominal pain. Patient states that she woke up with a fever this morning that was 102 orally. Patient reports that she then took over-the-counter Tylenol. Patient reports otherwise she is feeling well. Reports continues to eat and drink well. Denies nausea, vomiting, diarrhea. Patient denies known triggers but reports has been more sexually active recently. Unsure if always voids post intercourse. Reports did notice blood in her urine today. Denies any other abnormal bleeding. Denies blood in stool.  Denies vaginal complaints, vaginal pain, pelvic pain. Denies other fevers. Denies chest pain and shortness breath, abdominal pain, extremity pain or extremity swelling. Reports she has had a urinary tract infection in the past which felt similar. Denies recent sickness. Denies recent antibiotic use. Denies any known resistance to antibiotics  Patient's last menstrual period was 12/15/2015 (exact date). Denies concerns of pregnancy.  PCP Jesse Fall   Past Medical History:  Diagnosis Date  . Anxiety   . Hypertension   . Panic attacks     Patient Active Problem List   Diagnosis Date Noted  . Admission for sterilization 11/28/2015    Past Surgical History:  Procedure Laterality Date  . CESAREAN SECTION     x 2  . LAPAROSCOPIC TUBAL LIGATION Bilateral 11/28/2015   Procedure: LAPAROSCOPIC TUBAL LIGATION;  Surgeon: Conard Novak, MD;  Location: [facility] ORS;  Service: Gynecology;  Laterality: Bilateral;    No  current facility-administered medications for this encounter.   Current Outpatient Prescriptions:  .  fluconazole (DIFLUCAN) 150 MG tablet, Take 1 tablet (150 mg total) by mouth once., Disp: 1 tablet, Rfl: 0 .  hydrochlorothiazide (HYDRODIURIL) 25 MG tablet, Take 25 mg by mouth daily., Disp: , Rfl:  .  PARoxetine (PAXIL) 30 MG tablet, Take 30 mg by mouth daily., Disp: , Rfl:  .  phenazopyridine (PYRIDIUM) 100 MG tablet, Take 2 tablets (200 mg total) by mouth 3 (three) times daily as needed for pain., Disp: 6 tablet, Rfl: 0 .  sulfamethoxazole-trimethoprim (BACTRIM DS,SEPTRA DS) 800-160 MG tablet, Take 1 tablet by mouth 2 (two) times daily., Disp: 14 tablet, Rfl: 0   Allergies Amoxicillin; Effexor [venlafaxine]; and Penicillins  History reviewed. No pertinent family history.  Social History Social History  Substance Use Topics  . Smoking status: Never Smoker  . Smokeless tobacco: Never Used  . Alcohol use No    Review of Systems Constitutional: As above. Eyes: No visual changes. ENT: No sore throat. Cardiovascular: Denies chest pain. Respiratory: Denies shortness of breath. Gastrointestinal: No abdominal pain.  No nausea, no vomiting.  No diarrhea.  No constipation. Genitourinary: Positive for dysuria. Musculoskeletal: Positive for back pain. Skin: Negative for rash. Neurological: Negative for headaches, focal weakness or numbness.  10-point ROS otherwise negative.  ____________________________________________   PHYSICAL EXAM:  VITAL SIGNS: ED Triage Vitals  Enc Vitals Group     BP 12/29/15 0845 133/87     Pulse Rate 12/29/15 0845 83     Resp 12/29/15 0845 16     Temp 12/29/15 0845 98.8 F (37.1 C)     Temp Source 12/29/15 0845  Tympanic     SpO2 12/29/15 0845 100 %     Weight 12/29/15 0845 230 lb (104.3 kg)     Height 12/29/15 0845  (1.626 m)     Head Circumference --      Peak Flow --      Pain Score 12/29/15 0850 3     Pain Loc --      Pain Edu? --       Excl. in GC? --     Constitutional: Alert and oriented. Well appearing and in no acute distress. Eyes: Conjunctivae are normal. PERRL. EOMI. Head: Atraumatic.  Ears: Normal external appearance bilaterally.  Nose: No congestion/rhinnorhea.  Mouth/Throat: Mucous membranes are moist.  Oropharynx non-erythematous. Neck: No stridor.  No cervical spine tenderness to palpation. Hematological/Lymphatic/Immunilogical: No cervical lymphadenopathy. Cardiovascular: Normal rate, regular rhythm. Grossly normal heart sounds.  Good peripheral circulation. Respiratory: Normal respiratory effort.  No retractions. Lungs CTAB.No wheezes, rales or rhonchi.  Gastrointestinal: Minimal midline suprapubic tenderness to palpation, abdomen otherwise Soft and nontender. Obese abdomen. Normal Bowel sounds. No CVA tenderness. Musculoskeletal: No lower or upper extremity tenderness nor edema.  No midline cervical, thoracic or lumbar tenderness to palpation. Mild right and left lower paralumbar tenderness to palpation, full range of motion, no ecchymosis, no erythema, no CVA tenderness.  Neurologic:  Normal speech and language. No gross focal neurologic deficits are appreciated. No gait instability. Skin:  Skin is warm, dry and intact. No rash noted. Psychiatric: Mood and affect are normal. Speech and behavior are normal.  ____________________________________________   LABS (all labs ordered are listed, but only abnormal results are displayed)  Labs Reviewed  URINALYSIS COMPLETEWITH MICROSCOPIC ([facility] ONLY) - Abnormal; Notable for the following:       Result Value   APPearance CLOUDY (*)    Bilirubin Urine SMALL (*)    Hgb urine dipstick LARGE (*)    Protein, ur >300 (*)    Leukocytes, UA MODERATE (*)    Bacteria, UA FEW (*)    Squamous Epithelial / LPF 0-5 (*)    All other components within normal limits  URINE CULTURE  PREGNANCY, URINE     PROCEDURES Procedures   _______________________________________   INITIAL IMPRESSION / ASSESSMENT AND PLAN / ED COURSE  Pertinent labs & imaging results that were available during my care of the patient were reviewed by me and considered in my medical decision making (see chart for details).  Well-appearing patient. No acute distress. Patient smiling and laughing in room during exam. Dysuria 3 days gradual onset. Urinalysis with few bacteria, moderate leukocytes, protein, hemoglobin, cloudy appearance and 0-5 epithelial cells. Urinary tract infection. Urine pregnancy negative. Discussed in detail with patient we'll treat with oral Bactrim and when necessary Pyridium. Patient requests prescription for Diflucan, 1 tab given. Encouraged patient to closely see the follow-up and recheck of urine in one week. Encouraged patient to closely monitor symptoms including fever. Encourage fluids. Discussed close follow up. Patient to follow-up with PCP however if symptoms do not improve or if fever continues, increased pain and inability to tolerate oral medication patient needs to promptly seek reevaluation including up to proceed to emergency room.  Discussed follow up with Primary care physician this week. Discussed follow up and return parameters including no resolution or any worsening concerns. Patient verbalized understanding and agreed to plan.   ____________________________________________   FINAL CLINICAL IMPRESSION(S) / ED DIAGNOSES  Final diagnoses:  UTI (lower urinary tract infection)     Discharge Medication List as  of 12/29/2015  9:19 AM    START taking these medications   Details  fluconazole (DIFLUCAN) 150 MG tablet Take 1 tablet (150 mg total) by mouth once., Starting Sun 12/29/2015, Normal    phenazopyridine (PYRIDIUM) 100 MG tablet Take 2 tablets (200 mg total) by mouth 3 (three) times daily as needed for pain., Starting Sun 12/29/2015, Normal    sulfamethoxazole-trimethoprim (BACTRIM DS,SEPTRA DS)  800-160 MG tablet Take 1 tablet by mouth 2 (two) times daily., Starting Sun 12/29/2015, Until Sun 01/05/2016, Normal        Note: This dictation was prepared with Dragon dictation along with smaller phrase technology. Any transcriptional errors that result from this process are unintentional.    Clinical Course      Renford Dills, NP 12/29/15 1051

## 2016-01-01 LAB — URINE CULTURE: Culture: >=100000 — AB

## 2017-06-15 ENCOUNTER — Ambulatory Visit
Admission: EM | Admit: 2017-06-15 | Discharge: 2017-06-15 | Disposition: A | Discharge status: Home / Self Care | Payer: Medicaid Other | Attending: Family Medicine | Admitting: Family Medicine

## 2017-06-15 ENCOUNTER — Encounter: Payer: Self-pay | Admitting: *Deleted

## 2017-06-15 DIAGNOSIS — S61412A Laceration without foreign body of left hand, initial encounter: Secondary | ICD-10-CM | POA: Diagnosis not present

## 2017-06-15 DIAGNOSIS — W260XXA Contact with knife, initial encounter: Secondary | ICD-10-CM

## 2017-06-15 MED ORDER — MUPIROCIN 2 % EX OINT: 1.0000 "application " | TOPICAL_OINTMENT | Freq: Three times a day (TID) | CUTANEOUS | 0 refills | Status: DC

## 2017-06-15 NOTE — Discharge Instructions (Signed)
Bactroban 3 times daily until sutures removed.  Remove sutures in 10 days.  If any signs of infection occur return to our clinic immediately

## 2017-06-15 NOTE — ED Provider Notes (Signed)
[facility]    CSN: [id number] Arrival date & time: 06/15/17  1344     History   Chief Complaint Chief Complaint  Patient presents with  . Stab Wound    HPI Monique Horne is a 37 y.o. female.   HPI  37 year old female presents with a laceration to her left nondominant hand at the base of her thumb.  He was trying to cut a zip tie holding knives together slipped and stabbed her in the base of her thumb.  Knife was new.  Her tetanus injections are up-to-date.         Past Medical History:  Diagnosis Date  . Anxiety   . Hypertension   . Panic attacks     Patient Active Problem List   Diagnosis Date Noted  . Admission for sterilization 11/28/2015    Past Surgical History:  Procedure Laterality Date  . CESAREAN SECTION     x 2  . LAPAROSCOPIC TUBAL LIGATION Bilateral 11/28/2015   Procedure: LAPAROSCOPIC TUBAL LIGATION;  Surgeon: Conard Novak, MD;  Location: [facility] ORS;  Service: Gynecology;  Laterality: Bilateral;    OB History    No data available       Home Medications    Prior to Admission medications   Medication Sig Start Date End Date Taking? Authorizing Provider  hydrochlorothiazide (HYDRODIURIL) 25 MG tablet Take 25 mg by mouth daily.   Yes [provider]  PARoxetine (PAXIL) 30 MG tablet Take 30 mg by mouth daily.   Yes [provider]  mupirocin ointment (BACTROBAN) 2 % Apply 1 application topically 3 (three) times daily. 06/15/17   Lutricia Feil, PA-C    Family History Family History  Problem Relation Age of Onset  . Healthy Mother     Social History Social History   Tobacco Use  . Smoking status: Never Smoker  . Smokeless tobacco: Never Used  Substance Use Topics  . Alcohol use: No  . Drug use: No     Allergies   Amoxicillin; Effexor [venlafaxine]; and Penicillins   Review of Systems Review of Systems  Constitutional: Positive for activity change. Negative for chills, fatigue and  fever.  Skin: Positive for wound.  All other systems reviewed and are negative.    Physical Exam Triage Vital Signs ED Triage Vitals  Enc Vitals Group     BP 06/15/17 1355 (!) 152/104     Pulse Rate 06/15/17 1355 76     Resp 06/15/17 1355 16     Temp 06/15/17 1355 98.7 F (37.1 C)     Temp Source 06/15/17 1355 Oral     SpO2 06/15/17 1355 100 %     Weight 06/15/17 1355 240 lb (108.9 kg)     Height 06/15/17 1355 5\' 4"  (1.626 m)     Head Circumference --      Peak Flow --      Pain Score 06/15/17 1518 0     Pain Loc --      Pain Edu? --      Excl. in GC? --    No data found.  Updated Vital Signs BP (!) 152/104 (BP Location: Left Arm)   Pulse 76   Temp 98.7 F (37.1 C) (Oral)   Resp 16   Ht 5\' 4"  (1.626 m)   Wt 240 lb (108.9 kg)   SpO2 100%   BMI 41.20 kg/m   Visual Acuity Right Eye Distance:   Left Eye Distance:   Bilateral  Distance:    Right Eye Near:   Left Eye Near:    Bilateral Near:     Physical Exam  Constitutional: She is oriented to person, place, and time. She appears well-developed and well-nourished. No distress.  HENT:  Head: Normocephalic.  Eyes: Pupils are equal, round, and reactive to light.  Neck: Normal range of motion.  Musculoskeletal: Normal range of motion.  Neurological: She is alert and oriented to person, place, and time.  Skin: Skin is warm and dry. She is not diaphoretic.  Examination of the left nondominant hand shows a transversely oriented laceration over the base of the thumb dorsum.  Laceration extends into the subcutaneous tissues.  Tendons show than to be strong and intact.  Sensation is intact to light touch throughout.  Psychiatric: She has a normal mood and affect. Her behavior is normal. Judgment and thought content normal.  Nursing note and vitals reviewed.    UC Treatments / Results  Labs (all labs ordered are listed, but only abnormal results are displayed) Labs Reviewed - No data to display  EKG  EKG  Interpretation None       Radiology No results found.  Procedures Laceration Repair Date/Time: 06/15/2017 3:59 PM Performed by: Lutricia Feil, PA-C Authorized by: Tommie Sams, DO   Consent:    Consent obtained:  Verbal   Consent given by:  Patient   Risks discussed:  Infection Anesthesia (see MAR for exact dosages):    Anesthesia method:  Local infiltration   Local anesthetic:  Lidocaine 1% w/o epi Laceration details:    Location:  Hand   Hand location:  L hand, dorsum   Length (cm):  1   Depth (mm):  2 Repair type:    Repair type:  Simple Pre-procedure details:    Preparation:  Patient was prepped and draped in usual sterile fashion Exploration:    Hemostasis achieved with:  Direct pressure   Wound exploration: entire depth of wound probed and visualized     Contaminated: no   Treatment:    Area cleansed with:  Betadine   Amount of cleaning:  Extensive   Irrigation solution:  Sterile water   Irrigation volume:  90   Irrigation method:  Pressure wash   Visualized foreign bodies/material removed: no   Skin repair:    Repair method:  Sutures   Number of sutures:  3 Approximation:    Approximation:  Close   Vermilion border: well-aligned   Post-procedure details:    Dressing:  Non-adherent dressing and sterile dressing   Patient tolerance of procedure:  Tolerated well, no immediate complications   (including critical care time)  Medications Ordered in UC Medications - No data to display   Initial Impression / Assessment and Plan / UC Course  I have reviewed the triage vital signs and the nursing notes.  Pertinent labs & imaging results that were available during my care of the patient were reviewed by me and considered in my medical decision making (see chart for details).     Plan: 1. Test/x-ray results and diagnosis reviewed with patient 2. rx as per orders; risks, benefits, potential side effects reviewed with patient 3. Recommend supportive  treatment with dry for 24 hours.  Afterwards start a washing program 3 times daily applying Bactroban ointment.  Signs and symptoms of infection were outlined in detail with the patient.  His occur they will return to our clinic immediately.  Plan on suture removal in 10 days 4. F/u prn  if symptoms worsen or don't improve   Final Clinical Impressions(s) / UC Diagnoses   Final diagnoses:  Laceration of left hand without foreign body, initial encounter    ED Discharge Orders        Ordered    mupirocin ointment (BACTROBAN) 2 %  3 times daily     06/15/17 1508       Controlled Substance Prescriptions [place] Controlled Substance Registry consulted? Not Applicable   Lutricia FeilRoemer, Andilyn Bettcher P, PA-C 06/15/17 1610

## 2017-06-15 NOTE — ED Triage Notes (Signed)
Pt was opening package on new knife and accidentally stabbed the base of her left thumb. Bleeding controlled.

## 2017-06-18 ENCOUNTER — Telehealth: Payer: Self-pay | Admitting: Emergency Medicine

## 2017-06-18 NOTE — Telephone Encounter (Signed)
Tried calling patient to follow-up on how patient was doing [facility]- no answer and unable to leave a message due to mailbox not being set up.

## 2017-09-28 ENCOUNTER — Encounter: Payer: Self-pay | Admitting: Obstetrics and Gynecology

## 2017-11-11 ENCOUNTER — Other Ambulatory Visit: Payer: Self-pay

## 2017-11-11 ENCOUNTER — Ambulatory Visit
Admission: EM | Admit: 2017-11-11 | Discharge: 2017-11-11 | Disposition: A | Discharge status: Home / Self Care | Payer: Medicaid Other | Attending: Family Medicine | Admitting: Family Medicine

## 2017-11-11 DIAGNOSIS — J0100 Acute maxillary sinusitis, unspecified: Secondary | ICD-10-CM | POA: Diagnosis not present

## 2017-11-11 DIAGNOSIS — R05 Cough: Secondary | ICD-10-CM

## 2017-11-11 DIAGNOSIS — H65191 Other acute nonsuppurative otitis media, right ear: Secondary | ICD-10-CM

## 2017-11-11 DIAGNOSIS — R059 Cough, unspecified: Secondary | ICD-10-CM

## 2017-11-11 MED ORDER — DOXYCYCLINE HYCLATE 100 MG PO CAPS: 100.0000 mg | ORAL_CAPSULE | Freq: Two times a day (BID) | ORAL | 0 refills | Status: DC

## 2017-11-11 MED ORDER — HYDROCOD POLST-CPM POLST ER 10-8 MG/5ML PO SUER: 5.0000 mL | Freq: Every evening | ORAL | 0 refills | Status: DC | PRN

## 2017-11-11 NOTE — Discharge Instructions (Addendum)
Take medication as prescribed. Rest. Drink plenty of fluids.  Over-the-counter antihistamines and decongestants as discussed.  Follow up with your primary care physician this week as needed. Return to Urgent care for new or worsening concerns.

## 2017-11-11 NOTE — ED Triage Notes (Signed)
Pt with 2 weeks of cough productive of yellow phlegm, sinus congestion and right ear pain (past 3-4 days). Pain 4/10

## 2017-11-11 NOTE — ED Provider Notes (Signed)
[facility] ____________________________________________  Time seen: Approximately 1:00 PM  I have reviewed the triage vital signs and the nursing notes.   HISTORY  Chief Complaint Sinusitis and Otalgia  HPI TIMMIE CALIX is a 37 y.o. female presented for evaluation of 2 weeks of runny nose, nasal congestion, cough and postnasal drainage.  Reports of the last several days she has had increasing sinus pressure discomfort as well as right ear discomfort.  States right ear discomfort currently is mild to moderate.  Reports some sore throat associated with postnasal drainage and cough.  States cough is also worse at night with postnasal drainage and disrupt sleep.,   Reports productive of yellowish sputum.  States has had intermittent fevers.  Taking over-the-counter cough and congestion multiple agents.  Continues to eat and drink well.  Has continued to remain active. Denies chest pain, shortness of breath, abdominal pain. Denies recent sickness. Denies recent antibiotic use.   Via, Jeanella Craze, MD: PCP Patient's last menstrual period was 10/21/2017.Denies pregnancy.   Past Medical History:  Diagnosis Date  . Anxiety   . Hypertension   . Panic attacks     Patient Active Problem List   Diagnosis Date Noted  . Admission for sterilization 11/28/2015    Past Surgical History:  Procedure Laterality Date  . CESAREAN SECTION     x 2  . LAPAROSCOPIC TUBAL LIGATION Bilateral 11/28/2015   Procedure: LAPAROSCOPIC TUBAL LIGATION;  Surgeon: Conard Novak, MD;  Location: [facility] ORS;  Service: Gynecology;  Laterality: Bilateral;     No current facility-administered medications for this encounter.   Current Outpatient Medications:  .  chlorpheniramine-HYDROcodone (TUSSIONEX PENNKINETIC ER) 10-8 MG/5ML SUER, Take 5 mLs by mouth at bedtime as needed for cough. do not drive or operate machinery while taking as can cause drowsiness., Disp: 50 mL, Rfl: 0 .  citalopram (CELEXA) 20  MG tablet, Take 20 mg by mouth daily., Disp: , Rfl: 2 .  doxycycline (VIBRAMYCIN) 100 MG capsule, Take 1 capsule (100 mg total) by mouth 2 (two) times daily., Disp: 20 capsule, Rfl: 0 .  hydrochlorothiazide (HYDRODIURIL) 25 MG tablet, Take 25 mg by mouth daily., Disp: , Rfl:   Allergies Amoxicillin; Effexor [venlafaxine]; and Penicillins  Family History  Problem Relation Age of Onset  . Healthy Mother     Social History Social History   Tobacco Use  . Smoking status: Never Smoker  . Smokeless tobacco: Never Used  Substance Use Topics  . Alcohol use: No  . Drug use: No    Review of Systems Constitutional: As above. ENT: as above. Cardiovascular: Denies chest pain. Respiratory: Denies shortness of breath. Gastrointestinal: No abdominal pain.   Genitourinary: Negative for dysuria. Musculoskeletal: Negative for back pain. Skin: Negative for rash.  ____________________________________________   PHYSICAL EXAM:  VITAL SIGNS: ED Triage Vitals  Enc Vitals Group     BP 11/11/17 1248 122/78     Pulse Rate 11/11/17 1248 60     Resp 11/11/17 1248 18     Temp 11/11/17 1248 98.3 F (36.8 C)     Temp Source 11/11/17 1248 Oral     SpO2 11/11/17 1248 100 %     Weight 11/11/17 1250 230 lb (104.3 kg)     Height 11/11/17 1250 5\' 4"  (1.626 m)     Head Circumference --      Peak Flow --      Pain Score 11/11/17 1249 4     Pain Loc --  Pain Edu? --      Excl. in GC? --    Constitutional: Alert and oriented. Well appearing and in no acute distress. Eyes: Conjunctivae are normal.  Head: Atraumatic.Mild tenderness to palpation bilateral frontal and moderate tenderness to bilateral maxillary sinuses, R>L. No swelling. No erythema.  Mild right TMJ tenderness, no left TMJ tenderness, full range of motion present.  Ears: Left: Nontender, normal canal, no erythema.  Right: Mild tenderness auricle movement, normal canal, minimal erythema with mild effusion present, TM otherwise  normal-appearing.  No mastoid tenderness bilaterally.  Nose: nasal congestion with bilateral nasal turbinate erythema and edema.   Mouth/Throat: Mucous membranes are moist.  Oropharynx non-erythematous.No tonsillar swelling or exudate.  Neck: No stridor.  No cervical spine tenderness to palpation. Hematological/Lymphatic/Immunilogical: No cervical lymphadenopathy. Cardiovascular: Normal rate, regular rhythm. Grossly normal heart sounds.  Good peripheral circulation. Respiratory: Normal respiratory effort.  No retractions.  No wheezes, rales or rhonchi. Good air movement.  Dry intermittent cough noted in room. Musculoskeletal: Steady gait. Neurologic:  Normal speech and language. No gait instability. Skin:  Skin is warm, dry and intact. No rash noted. Psychiatric: Mood and affect are normal. Speech and behavior are normal.  ___________________________________________   LABS (all labs ordered are listed, but only abnormal results are displayed)  Labs Reviewed - No data to display ____________________________________________    PROCEDURES Procedures    INITIAL IMPRESSION / ASSESSMENT AND PLAN / ED COURSE  Pertinent labs & imaging results that were available during my care of the patient were reviewed by me and considered in my medical decision making (see chart for details).  Well-appearing patient.  No acute distress.  Suspect recent viral upper respiratory infection versus allergic rhinitis with secondary sinusitis.  Continue home antihistamine and over-the-counter decongestants, and will treat with oral doxycycline and PRN Tussionex at night.  Also discussed over-the-counter NSAIDs as needed for right TMJ tenderness.  Encourage rest, fluids, supportive care.Discussed indication, risks and benefits of medications with patient.  Discussed follow up with Primary care physician this week. Discussed follow up and return parameters including no resolution or any worsening concerns. Patient  verbalized understanding and agreed to plan.   ____________________________________________   FINAL CLINICAL IMPRESSION(S) / ED DIAGNOSES  Final diagnoses:  Acute maxillary sinusitis, recurrence not specified  Acute effusion of right ear  Cough     ED Discharge Orders        Ordered    doxycycline (VIBRAMYCIN) 100 MG capsule  2 times daily     11/11/17 1308    chlorpheniramine-HYDROcodone (TUSSIONEX PENNKINETIC ER) 10-8 MG/5ML SUER  At bedtime PRN     11/11/17 1308       Note: This dictation was prepared with Dragon dictation along with smaller phrase technology. Any transcriptional errors that result from this process are unintentional.         Renford DillsMiller, Lindberg Zenon, NP 11/11/17 1400

## 2017-11-21 ENCOUNTER — Other Ambulatory Visit: Payer: Self-pay

## 2017-11-21 ENCOUNTER — Ambulatory Visit
Admission: EM | Admit: 2017-11-21 | Discharge: 2017-11-21 | Disposition: A | Discharge status: Home / Self Care | Payer: Medicaid Other | Attending: Internal Medicine | Admitting: Internal Medicine

## 2017-11-21 DIAGNOSIS — H66001 Acute suppurative otitis media without spontaneous rupture of ear drum, right ear: Secondary | ICD-10-CM | POA: Diagnosis not present

## 2017-11-21 MED ORDER — FLUCONAZOLE 200 MG PO TABS: ORAL_TABLET | ORAL | 0 refills | Status: DC

## 2017-11-21 MED ORDER — LEVOFLOXACIN 500 MG PO TABS
500.0000 mg | ORAL_TABLET | Freq: Every day | ORAL | 0 refills | Status: DC
Start: 2017-11-21 — End: 2019-08-04

## 2017-11-21 NOTE — Discharge Instructions (Addendum)
-  Take medications as prescribed. -Avoid Q-tips. -Follow-up if no improvement.

## 2017-11-21 NOTE — ED Provider Notes (Signed)
[facility]    CSN: [id number] Arrival date & time: 11/21/17  1153     History   Chief Complaint Chief Complaint  Patient presents with  . Ear Problem    HPI Monique Horne is a 37 y.o. female who presents today for right ear pain.  The patient was last evaluated on 11/11/2017 and was diagnosed with sinusitis and was treated with doxycycline.  The patient states that the facial pressure, cough and congestion that she was experiencing then has completely resolved however she continues to report fullness in the right ear in addition to drainage from the right ear.  She denies any trauma or injury affecting the right ear.  She denies any shortness of breath abdominal pain or chest pain.  She denies having any side effects from taking the doxycycline.  HPI  Past Medical History:  Diagnosis Date  . Anxiety   . Hypertension   . Panic attacks     Patient Active Problem List   Diagnosis Date Noted  . Admission for sterilization 11/28/2015    Past Surgical History:  Procedure Laterality Date  . CESAREAN SECTION     x 2  . LAPAROSCOPIC TUBAL LIGATION Bilateral 11/28/2015   Procedure: LAPAROSCOPIC TUBAL LIGATION;  Surgeon: Conard NovakStephen D Jackson, MD;  Location: [facility] ORS;  Service: Gynecology;  Laterality: Bilateral;    OB History   None      Home Medications    Prior to Admission medications   Medication Sig Start Date End Date Taking? Authorizing Provider  chlorpheniramine-HYDROcodone (TUSSIONEX PENNKINETIC ER) 10-8 MG/5ML SUER Take 5 mLs by mouth at bedtime as needed for cough. do not drive or operate machinery while taking as can cause drowsiness. 11/11/17   Renford DillsMiller, Lindsey, NP  citalopram (CELEXA) 20 MG tablet Take 20 mg by mouth daily. 09/07/17   [provider]  doxycycline (VIBRAMYCIN) 100 MG capsule Take 1 capsule (100 mg total) by mouth 2 (two) times daily. 11/11/17   Renford DillsMiller, Lindsey, NP  fluconazole (DIFLUCAN) 200 MG tablet Take 1 tablet as signs of yeast  infection.  Take second tablet 3 days later. 11/21/17   Anson OregonMcGhee, Alverta Caccamo Lance, PA-C  hydrochlorothiazide (HYDRODIURIL) 25 MG tablet Take 25 mg by mouth daily.    [provider]  levofloxacin (LEVAQUIN) 500 MG tablet Take 1 tablet (500 mg total) by mouth daily. 11/21/17   Anson OregonMcGhee, Legrand Lasser Lance, PA-C    Family History Family History  Problem Relation Age of Onset  . Healthy Mother     Social History Social History   Tobacco Use  . Smoking status: Never Smoker  . Smokeless tobacco: Never Used  Substance Use Topics  . Alcohol use: No  . Drug use: No     Allergies   Amoxicillin; Effexor [venlafaxine]; and Penicillins   Review of Systems Review of Systems  HENT: Positive for ear discharge and ear pain.   All other systems reviewed and are negative.  Physical Exam Triage Vital Signs ED Triage Vitals  Enc Vitals Group     BP 11/21/17 1200 (!) 140/102     Pulse Rate 11/21/17 1200 62     Resp 11/21/17 1200 16     Temp 11/21/17 1200 98.6 F (37 C)     Temp Source 11/21/17 1200 Oral     SpO2 11/21/17 1200 99 %     Weight 11/21/17 1202 230 lb (104.3 kg)     Height 11/21/17 1202 5\' 4"  (1.626 m)     Head Circumference --  Peak Flow --      Pain Score 11/21/17 1202 0     Pain Loc --      Pain Edu? --      Excl. in GC? --    No data found.  Updated Vital Signs BP (!) 140/102 (BP Location: Left Arm)   Pulse 62   Temp 98.6 F (37 C) (Oral)   Resp 16   Ht 5\' 4"  (1.626 m)   Wt 230 lb (104.3 kg)   LMP 11/15/2017   SpO2 99%   BMI 39.48 kg/m   Visual Acuity Right Eye Distance:   Left Eye Distance:   Bilateral Distance:    Right Eye Near:   Left Eye Near:    Bilateral Near:     Physical Exam  Constitutional: Vital signs are normal. She appears well-nourished. She is active.  HENT:  Head: Normocephalic and atraumatic.  Right Ear: There is drainage. No tenderness. Tympanic membrane is bulging. A middle ear effusion is present.  Left Ear: Hearing, tympanic  membrane, external ear and ear canal normal.  Nose: Nose normal.  Mouth/Throat: Uvula is midline, oropharynx is clear and moist and mucous membranes are normal. No oropharyngeal exudate, posterior oropharyngeal edema or posterior oropharyngeal erythema.  Purulent appearance to the right tympanic membrane. Erythematous appearance to the ear canal  Cardiovascular: Normal rate, regular rhythm, S1 normal, S2 normal and normal heart sounds.  Pulmonary/Chest: Effort normal and breath sounds normal.  Neurological: She is alert.   UC Treatments / Results  Labs (all labs ordered are listed, but only abnormal results are displayed) Labs Reviewed - No data to display  EKG None  Radiology No results found.  Procedures Procedures (including critical care time)  Medications Ordered in UC Medications - No data to display  Initial Impression / Assessment and Plan / UC Course  I have reviewed the triage vital signs and the nursing notes.  Pertinent labs & imaging results that were available during my care of the patient were reviewed by me and considered in my medical decision making (see chart for details).     1.  Treatment options were discussed today with the patient. 2.  Due to purulent appearance to the right tympanic membrane will treat the patient with Levaquin as she is allergic to penicillin and she has just completed a course of doxycycline. 3.  Diflucan also prescribed as the patient has a history of yeast infections while taking antibiotics. 4.  If continued discomfort will follow up with [facility], contact clinic if symptoms fail to improve. Final Clinical Impressions(s) / UC Diagnoses   Final diagnoses:  Non-recurrent acute suppurative otitis media of right ear without spontaneous rupture of tympanic membrane     Discharge Instructions     -Take medications as prescribed. -Avoid Q-tips. -Follow-up if no improvement.   ED Prescriptions    Medication Sig  Dispense Auth. Provider   levofloxacin (LEVAQUIN) 500 MG tablet Take 1 tablet (500 mg total) by mouth daily. 10 tablet Anson Oregon, PA-C   fluconazole (DIFLUCAN) 200 MG tablet Take 1 tablet as signs of yeast infection.  Take second tablet 3 days later. 2 tablet Anson Oregon, PA-C     Controlled Substance Prescriptions [place] Controlled Substance Registry consulted? Not Applicable   Anson Oregon, PA-C 11/21/17 1458

## 2017-11-21 NOTE — ED Triage Notes (Signed)
Pt states she was seen here for fluid in her ear two weeks ago and sinus infection. Pain is better but today woke up and there was green/yellow drainage on her pillow and cant hear out of her ear. Finished ABX yesterday

## 2019-08-04 ENCOUNTER — Emergency Department: Payer: Medicaid Other

## 2019-08-04 ENCOUNTER — Encounter: Payer: Self-pay | Admitting: Emergency Medicine

## 2019-08-04 ENCOUNTER — Other Ambulatory Visit: Payer: Self-pay

## 2019-08-04 ENCOUNTER — Emergency Department
Admission: EM | Admit: 2019-08-04 | Discharge: 2019-08-04 | Payer: Medicaid Other | Attending: Student in an Organized Health Care Education/Training Program | Admitting: Student in an Organized Health Care Education/Training Program

## 2019-08-04 DIAGNOSIS — Z23 Encounter for immunization: Secondary | ICD-10-CM | POA: Insufficient documentation

## 2019-08-04 DIAGNOSIS — Y999 Unspecified external cause status: Secondary | ICD-10-CM | POA: Insufficient documentation

## 2019-08-04 DIAGNOSIS — S0292XB Unspecified fracture of facial bones, initial encounter for open fracture: Secondary | ICD-10-CM

## 2019-08-04 DIAGNOSIS — I10 Essential (primary) hypertension: Secondary | ICD-10-CM | POA: Diagnosis not present

## 2019-08-04 DIAGNOSIS — S0292XA Unspecified fracture of facial bones, initial encounter for closed fracture: Secondary | ICD-10-CM | POA: Insufficient documentation

## 2019-08-04 DIAGNOSIS — S0990XA Unspecified injury of head, initial encounter: Secondary | ICD-10-CM | POA: Diagnosis present

## 2019-08-04 DIAGNOSIS — Y9389 Activity, other specified: Secondary | ICD-10-CM | POA: Insufficient documentation

## 2019-08-04 DIAGNOSIS — Z79899 Other long term (current) drug therapy: Secondary | ICD-10-CM | POA: Diagnosis not present

## 2019-08-04 DIAGNOSIS — Y929 Unspecified place or not applicable: Secondary | ICD-10-CM | POA: Insufficient documentation

## 2019-08-04 LAB — COMPREHENSIVE METABOLIC PANEL
ALT: 12 U/L (ref 0–44)
AST: 28 U/L (ref 15–41)
Albumin: 4 g/dL (ref 3.5–5.0)
Alkaline Phosphatase: 81 U/L (ref 38–126)
Anion gap: 9 (ref 5–15)
BUN: 9 mg/dL (ref 6–20)
CO2: 26 mmol/L (ref 22–32)
Calcium: 9.1 mg/dL (ref 8.9–10.3)
Chloride: 104 mmol/L (ref 98–111)
Creatinine, Ser: 0.76 mg/dL (ref 0.44–1.00)
GFR calc Af Amer: >60 mL/min (ref >60)
GFR calc non Af Amer: >60 mL/min (ref >60)
Glucose, Bld: 118 mg/dL — ABNORMAL HIGH (ref 70–99)
Potassium: 3.3 mmol/L — ABNORMAL LOW (ref 3.5–5.1)
Sodium: 139 mmol/L (ref 135–145)
Total Bilirubin: 0.5 mg/dL (ref 0.3–1.2)
Total Protein: 8 g/dL (ref 6.5–8.1)

## 2019-08-04 LAB — CBC WITH DIFFERENTIAL/PLATELET
Abs Immature Granulocytes: 0.05 10*3/uL (ref 0.00–0.07)
Basophils Absolute: 0.1 10*3/uL (ref 0.0–0.1)
Basophils Relative: 0 %
Eosinophils Absolute: 0.1 10*3/uL (ref 0.0–0.5)
Eosinophils Relative: 1 %
HCT: 39.4 % (ref 36.0–46.0)
Hemoglobin: 12.4 g/dL (ref 12.0–15.0)
Immature Granulocytes: 0 %
Lymphocytes Relative: 8 %
Lymphs Abs: 1.1 10*3/uL (ref 0.7–4.0)
MCH: 26.3 pg (ref 26.0–34.0)
MCHC: 31.5 g/dL (ref 30.0–36.0)
MCV: 83.7 fL (ref 80.0–100.0)
Monocytes Absolute: 0.5 10*3/uL (ref 0.1–1.0)
Monocytes Relative: 4 %
Neutro Abs: 11.4 10*3/uL — ABNORMAL HIGH (ref 1.7–7.7)
Neutrophils Relative %: 87 %
Platelets: 415 10*3/uL — ABNORMAL HIGH (ref 150–400)
RBC: 4.71 MIL/uL (ref 3.87–5.11)
RDW: 14.6 % (ref 11.5–15.5)
WBC: 13.2 10*3/uL — ABNORMAL HIGH (ref 4.0–10.5)
nRBC: 0 % (ref 0.0–0.2)

## 2019-08-04 LAB — POCT PREGNANCY, URINE: Preg Test, Ur: NEGATIVE

## 2019-08-04 LAB — PROTIME-INR
INR: 0.9 (ref 0.8–1.2)
Prothrombin Time: 12.5 seconds (ref 11.4–15.2)

## 2019-08-04 MED ORDER — ONDANSETRON HCL 4 MG/2ML IJ SOLN: 4.0000 mg | Freq: Once | INTRAMUSCULAR | Status: AC

## 2019-08-04 MED ORDER — MORPHINE SULFATE (PF) 4 MG/ML IV SOLN
INTRAVENOUS | Status: AC
  Administered 2019-08-04: 12:00:00 4 mg via INTRAVENOUS
  Filled 2019-08-04: qty 1

## 2019-08-04 MED ORDER — ONDANSETRON HCL 4 MG/2ML IJ SOLN
INTRAMUSCULAR | Status: AC
  Administered 2019-08-04: 4 mg via INTRAVENOUS
  Filled 2019-08-04: qty 2

## 2019-08-04 MED ORDER — SODIUM CHLORIDE 0.9 % IV BOLUS
1000.0000 mL | Freq: Once | INTRAVENOUS | Status: AC
  Administered 2019-08-04: 10:00:00 1000 mL via INTRAVENOUS

## 2019-08-04 MED ORDER — MORPHINE SULFATE (PF) 4 MG/ML IV SOLN: 4.0000 mg | INTRAVENOUS | Status: DC | PRN

## 2019-08-04 MED ORDER — TRAMADOL HCL 50 MG PO TABS
50.0000 mg | ORAL_TABLET | Freq: Once | ORAL | Status: AC
  Administered 2019-08-04: 10:00:00 50 mg via ORAL
  Filled 2019-08-04: qty 1

## 2019-08-04 MED ORDER — TETANUS-DIPHTH-ACELL PERTUSSIS 5-2.5-18.5 LF-MCG/0.5 IM SUSP
0.5000 mL | Freq: Once | INTRAMUSCULAR | Status: AC
  Administered 2019-08-04: 12:00:00 0.5 mL via INTRAMUSCULAR
  Filled 2019-08-04: qty 0.5

## 2019-08-04 MED ORDER — ACETAMINOPHEN 325 MG PO TABS
650.0000 mg | ORAL_TABLET | Freq: Once | ORAL | Status: AC
  Administered 2019-08-04: 650 mg via ORAL
  Filled 2019-08-04: qty 2

## 2019-08-04 MED ORDER — PHENYLEPHRINE HCL 0.5 % NA SOLN
2.0000 [drp] | Freq: Once | NASAL | Status: AC
  Administered 2019-08-04: 09:00:00 2 [drp] via NASAL

## 2019-08-04 MED ORDER — TRAMADOL HCL 50 MG PO TABS
50.0000 mg | ORAL_TABLET | Freq: Once | ORAL | Status: DC
  Filled 2019-08-04: qty 1
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
## 2019-08-04 NOTE — ED Notes (Signed)
Paper consent signed for transfer and placed with chart.

## 2019-08-04 NOTE — ED Notes (Signed)
[facility]  CALLED  FOR  TRANSPORT  TO  [facility]  ED 

## 2019-08-04 NOTE — ED Notes (Signed)
[facility]SF[facility]  CALLED   

## 2019-08-04 NOTE — ED Notes (Signed)
EMS at bedside
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
## 2019-08-04 NOTE — ED Notes (Signed)
Pt denies any pain at this time. Pt has swelling noted to lip. Pt has laceration noted to upper inner lip area. Pt has suctioned set up for her nose at this time.  Pt has nose plug in place and bleeding seems to be somewhat controlled. Pt has some bruising noted under her eyes.

## 2019-08-04 NOTE — ED Notes (Signed)
X[facility]  POWERSHARE  WITH  [facility] 

## 2019-08-04 NOTE — ED Notes (Signed)
X[facility]  POWERSHARE  WITH  [facility] 

## 2019-08-04 NOTE — ED Notes (Signed)
Report called and given to Kandis Mannan at [facility]

## 2019-08-04 NOTE — ED Notes (Signed)
[facility] reviewed by this RN.  

## 2019-08-04 NOTE — ED Notes (Signed)
[facility]  TRANSF[facility]  CALLED

## 2020-05-28 ENCOUNTER — Encounter: Payer: Self-pay | Admitting: Otolaryngology

## 2020-05-28 ENCOUNTER — Other Ambulatory Visit: Payer: Self-pay

## 2020-06-04 ENCOUNTER — Other Ambulatory Visit: Payer: Self-pay

## 2020-06-04 ENCOUNTER — Other Ambulatory Visit
Admission: RE | Admit: 2020-06-04 | Discharge: 2020-06-04 | Disposition: A | Discharge status: Home / Self Care | Payer: Medicaid Other | Source: Ambulatory Visit | Attending: Otolaryngology | Admitting: Otolaryngology

## 2020-06-04 DIAGNOSIS — Z20822 Contact with and (suspected) exposure to covid-19: Secondary | ICD-10-CM | POA: Diagnosis not present

## 2020-06-04 DIAGNOSIS — Z01812 Encounter for preprocedural laboratory examination: Secondary | ICD-10-CM | POA: Diagnosis present

## 2020-06-05 LAB — SARS CORONAVIRUS 2 (TAT 6-24 HRS): SARS Coronavirus 2: NEGATIVE

## 2020-06-06 ENCOUNTER — Ambulatory Visit: Payer: Medicaid Other | Admitting: Anesthesiology

## 2020-06-06 ENCOUNTER — Other Ambulatory Visit: Payer: Self-pay

## 2020-06-06 ENCOUNTER — Encounter: Payer: Self-pay | Admitting: Otolaryngology

## 2020-06-06 ENCOUNTER — Encounter
Admission: RE | Disposition: A | Discharge status: Home / Self Care | Payer: Self-pay | Source: Home / Self Care | Attending: Otolaryngology

## 2020-06-06 ENCOUNTER — Ambulatory Visit
Admission: RE | Admit: 2020-06-06 | Discharge: 2020-06-06 | Disposition: A | Discharge status: Home / Self Care | Payer: Medicaid Other | Attending: Otolaryngology | Admitting: Otolaryngology

## 2020-06-06 DIAGNOSIS — J351 Hypertrophy of tonsils: Secondary | ICD-10-CM | POA: Diagnosis not present

## 2020-06-06 DIAGNOSIS — J3501 Chronic tonsillitis: Secondary | ICD-10-CM | POA: Diagnosis present

## 2020-06-06 DIAGNOSIS — Z79899 Other long term (current) drug therapy: Secondary | ICD-10-CM | POA: Insufficient documentation

## 2020-06-06 HISTORY — DX: Nausea with vomiting, unspecified: Z98.890

## 2020-06-06 HISTORY — DX: Motion sickness, initial encounter: T75.3XXA

## 2020-06-06 HISTORY — PX: TONSILLECTOMY: SHX5217

## 2020-06-06 HISTORY — DX: Presence of spectacles and contact lenses: Z97.3

## 2020-06-06 HISTORY — DX: Presence of dental prosthetic device (complete) (partial): Z97.2

## 2020-06-06 HISTORY — DX: Other specified postprocedural states: R11.2

## 2020-06-06 LAB — POCT PREGNANCY, URINE: Preg Test, Ur: NEGATIVE

## 2020-06-06 SURGERY — TONSILLECTOMY
Anesthesia: General | Site: Mouth | Laterality: Bilateral

## 2020-06-06 MED ORDER — ACETAMINOPHEN 10 MG/ML IV SOLN
1000.0000 mg | Freq: Once | INTRAVENOUS | Status: AC
  Administered 2020-06-06: 08:00:00 1000 mg via INTRAVENOUS

## 2020-06-06 MED ORDER — ONDANSETRON HCL 4 MG/2ML IJ SOLN
INTRAMUSCULAR | Status: DC | PRN
  Administered 2020-06-06: 4 mg via INTRAVENOUS

## 2020-06-06 MED ORDER — HYDROCODONE-ACETAMINOPHEN 7.5-325 MG/15ML PO SOLN: 15.0000 mL | ORAL | 0 refills | Status: AC | PRN

## 2020-06-06 MED ORDER — SCOPOLAMINE 1 MG/3DAYS TD PT72
1.0000 | MEDICATED_PATCH | TRANSDERMAL | Status: DC
  Administered 2020-06-06: 08:00:00 1.5 mg via TRANSDERMAL

## 2020-06-06 MED ORDER — HYDROCODONE-ACETAMINOPHEN 7.5-325 MG/15ML PO SOLN: 15.0000 mL | Freq: Four times a day (QID) | ORAL | 0 refills | Status: DC | PRN

## 2020-06-06 MED ORDER — GLYCOPYRROLATE 0.2 MG/ML IJ SOLN
INTRAMUSCULAR | Status: DC | PRN
  Administered 2020-06-06: .1 mg via INTRAVENOUS

## 2020-06-06 MED ORDER — OXYCODONE HCL 5 MG/5ML PO SOLN
5.0000 mg | Freq: Once | ORAL | Status: AC | PRN
  Administered 2020-06-06: 5 mg via ORAL

## 2020-06-06 MED ORDER — PROPOFOL 10 MG/ML IV BOLUS
INTRAVENOUS | Status: DC | PRN
  Administered 2020-06-06: 100 mg via INTRAVENOUS
  Administered 2020-06-06: 200 mg via INTRAVENOUS

## 2020-06-06 MED ORDER — PROMETHAZINE HCL 25 MG/ML IJ SOLN
INTRAMUSCULAR | Status: DC | PRN
  Administered 2020-06-06 (×2): 6.25 mg via INTRAVENOUS

## 2020-06-06 MED ORDER — SUCCINYLCHOLINE CHLORIDE 20 MG/ML IJ SOLN
INTRAMUSCULAR | Status: DC | PRN
  Administered 2020-06-06: 100 mg via INTRAVENOUS

## 2020-06-06 MED ORDER — OXYCODONE HCL 5 MG PO TABS: 5.0000 mg | ORAL_TABLET | Freq: Once | ORAL | Status: AC | PRN

## 2020-06-06 MED ORDER — FENTANYL CITRATE (PF) 100 MCG/2ML IJ SOLN: 25.0000 ug | INTRAMUSCULAR | Status: DC | PRN

## 2020-06-06 MED ORDER — FENTANYL CITRATE (PF) 100 MCG/2ML IJ SOLN
INTRAMUSCULAR | Status: DC | PRN
  Administered 2020-06-06: 50 ug via INTRAVENOUS
  Administered 2020-06-06: 100 ug via INTRAVENOUS

## 2020-06-06 MED ORDER — MIDAZOLAM HCL 5 MG/5ML IJ SOLN
INTRAMUSCULAR | Status: DC | PRN
  Administered 2020-06-06: 2 mg via INTRAVENOUS

## 2020-06-06 MED ORDER — LACTATED RINGERS IV SOLN: INTRAVENOUS | Status: DC

## 2020-06-06 MED ORDER — LIDOCAINE HCL (CARDIAC) PF 100 MG/5ML IV SOSY
PREFILLED_SYRINGE | INTRAVENOUS | Status: DC | PRN
  Administered 2020-06-06: 100 mg via INTRAVENOUS

## 2020-06-06 MED ORDER — DEXMEDETOMIDINE HCL 200 MCG/2ML IV SOLN
INTRAVENOUS | Status: DC | PRN
  Administered 2020-06-06: 10 ug via INTRAVENOUS

## 2020-06-06 MED ORDER — DEXAMETHASONE SODIUM PHOSPHATE 4 MG/ML IJ SOLN
INTRAMUSCULAR | Status: DC | PRN
  Administered 2020-06-06: 8 mg via INTRAVENOUS

## 2020-06-06 SURGICAL SUPPLY — 14 items
BLADE BOVIE TIP EXT 4 (BLADE) ×2 IMPLANT
CANISTER SUCT 1200ML W/VALVE (MISCELLANEOUS) ×2 IMPLANT
ELECT REM PT RETURN 9FT ADLT (ELECTROSURGICAL) ×2
ELECTRODE REM PT RTRN 9FT ADLT (ELECTROSURGICAL) ×1 IMPLANT
GLOVE PI ULTRA LF STRL 7.5 (GLOVE) ×1 IMPLANT
GLOVE PI ULTRA NON LATEX 7.5 (GLOVE) ×1
KIT TURNOVER KIT A (KITS) ×2 IMPLANT
PACK TONSIL AND ADENOID CUSTOM (PACKS) ×2 IMPLANT
PENCIL SMOKE EVACUATOR (MISCELLANEOUS) ×2 IMPLANT
SLEEVE SUCTION 125 (MISCELLANEOUS) ×2 IMPLANT
SOL ANTI-FOG 6CC FOG-OUT (MISCELLANEOUS) ×1 IMPLANT
SOL FOG-OUT ANTI-FOG 6CC (MISCELLANEOUS) ×1
SPONGE TONSIL .75 RFD DBL STRL (DISPOSABLE) ×1 IMPLANT
STRAP BODY AND KNEE 60X3 (MISCELLANEOUS) ×2 IMPLANT

## 2020-06-06 NOTE — Anesthesia Procedure Notes (Signed)
Procedure Name: Intubation Date/Time: 06/06/2020 8:58 AM Performed by: Silvana Newness, CRNA Pre-anesthesia Checklist: Patient identified, Emergency Drugs available, Suction available, Patient being monitored and Timeout performed Patient Re-evaluated:Patient Re-evaluated prior to induction Oxygen Delivery Method: Circle system utilized Preoxygenation: Pre-oxygenation with 100% oxygen Induction Type: IV induction Ventilation: Mask ventilation without difficulty Laryngoscope Size: Mac and 3 Grade View: Grade II Tube type: Oral Rae Tube size: 7.0 mm Number of attempts: 1 Airway Equipment and Method: Stylet and Oral airway Placement Confirmation: ETT inserted through vocal cords under direct vision,  positive ETCO2 and breath sounds checked- equal and bilateral Tube secured with: Tape Dental Injury: Teeth and Oropharynx as per pre-operative assessment

## 2020-06-06 NOTE — Transfer of Care (Signed)
Immediate Anesthesia Transfer of Care Note  Patient: Monique Horne  Procedure(s) Performed: TONSILLECTOMY (Bilateral Mouth)  Patient Location: PACU  Anesthesia Type: General ETT  Level of Consciousness: awake, alert  and patient cooperative  Airway and Oxygen Therapy: Patient Spontanous Breathing and Patient connected to supplemental oxygen  Post-op Assessment: Post-op Vital signs reviewed, Patient's Cardiovascular Status Stable, Respiratory Function Stable, Patent Airway and No signs of Nausea or vomiting  Post-op Vital Signs: Reviewed and stable  Complications: No complications documented.

## 2020-06-06 NOTE — Anesthesia Postprocedure Evaluation (Signed)
Anesthesia Post Note  Patient: Monique Horne  Procedure(s) Performed: TONSILLECTOMY (Bilateral Mouth)     Patient location during evaluation: PACU Anesthesia Type: General Level of consciousness: awake and alert Pain management: pain level controlled Vital Signs Assessment: post-procedure vital signs reviewed and stable Respiratory status: spontaneous breathing Cardiovascular status: stable Anesthetic complications: no   No complications documented.  Marvis Repress

## 2020-06-06 NOTE — Discharge Instructions (Signed)
T & A INSTRUCTION SHEET - [facility]  PAUL JUENGEL, MD  [address] [place], [place] [address] TEL.  [phone]  INFORMATION SHEET FOR A TONSILLECTOMY AND ADENDOIDECTOMY  About Your Tonsils and Adenoids  The tonsils and adenoids are normal body tissues that are part of our immune system.  They normally help to protect us against diseases that may enter our mouth and nose. However, sometimes the tonsils and/or adenoids become too large and obstruct our breathing, especially at night.    If either of these things happen it helps to remove the tonsils and adenoids in order to become healthier. The operation to remove the tonsils and adenoids is called a tonsillectomy and adenoidectomy.  The Location of Your Tonsils and Adenoids  The tonsils are located in the back of the throat on both side and sit in a cradle of muscles. The adenoids are located in the roof of the mouth, behind the nose, and closely associated with the opening of the Eustachian tube to the ear.  Surgery on Tonsils and Adenoids  A tonsillectomy and adenoidectomy is a short operation which takes about thirty minutes.  This includes being put to sleep and being awakened. Tonsillectomies and adenoidectomies are performed at [facility] and may require observation period in the recovery room prior to going home. Children are required to remain in recovery for at least 45 minutes.   Following the Operation for a Tonsillectomy  A cautery machine is used to control bleeding. Bleeding from a tonsillectomy and adenoidectomy is minimal and postoperatively the risk of bleeding is approximately four percent, although this rarely life threatening.  After your tonsillectomy and adenoidectomy post-op care at home: 1. Our patients are able to go home the same day. You may be given prescriptions for pain medications, if indicated. 2. It is extremely important to  remember that fluid intake is of utmost importance after a tonsillectomy. The amount that you drink must be maintained in the postoperative period. A good indication of whether a child is getting enough fluid is whether his/her urine output is constant. As long as children are urinating or wetting their diaper every 6 - 8 hours this is usually enough fluid intake.   3. Although rare, this is a risk of some bleeding in the first ten days after surgery. This usually occurs between day five and nine postoperatively. This risk of bleeding is approximately four percent. If you or your child should have any bleeding you should remain calm and notify our office or go directly to the emergency room at [facility] where they will contact us. Our doctors are available seven days a week for notification. We recommend sitting up quietly in a chair, place an ice pack on the front of the neck and spitting out the blood gently until we are able to contact you. Adults should gargle gently with ice water and this may help stop the bleeding. If the bleeding does not stop after a short time, i.e. 10 to 15 minutes, or seems to be increasing again, please contact us or go to the hospital.   4. It is common for the pain to be worse at 5 - 7 days postoperatively. This occurs because the "scab" is peeling off and the mucous membrane (skin of the throat) is growing back where the tonsils were.   5. It is common for a low-grade fever, less than 102, during the first week   after a tonsillectomy and adenoidectomy. It is usually due to not drinking enough liquids, and we suggest your use liquid Tylenol (acetaminophen) or the pain medicine with Tylenol (acetaminophen) prescribed in order to keep your temperature below 102. Please follow the directions on the back of the bottle. 6. Recommendations for post-operative pain in children and adults: a) For Children 12 and younger: Recommendations are for oral Tylenol  (acetaminophen) and oral Motrin (ibuprofen). Administer the Tylenol (acetaminophen) and Motrin as stated on bottle for patient's age/weight. Sometimes it may be necessary to alternate the Tylenol (acetaminophen) and Motrin for improved pain control. Motrin (ibuprofen) does last slightly longer so many patients benefit from being given this prior to bedtime. All children should avoid Aspirin products for 2 weeks following surgery. b) For children over the age of 12: Tylenol (acetaminophen) is the preferred first choice for pain control. Depending on your child's size, sometimes they will be given a combination of Tylenol (acetaminophen) and hydrocodone medication or sometimes it will be recommended they take Motrin (ibuprofen) in addition to the Tylenol (acetaminophen). Narcotics should always be used with caution in children following surgery as they can suppress their breathing and switching to over the counter Tylenol (acetaminophen) and Motrin (ibuprofen) as soon as possible is recommended. All patients should avoid Aspirin products for 2 weeks following surgery. c) Adults: Usually adults will require a narcotic pain medication following a tonsillectomy. This usually has either hydrocodone or oxycodone in it and can usually be taken every 4 to 6 hours as needed for moderate pain. If the medication does not have Tylenol (acetaminophen) in it, you may also supplement Tylenol (acetaminophen) as needed every 4 to 6 hours for breakthrough or mild pain. Adults should avoid Aspirin, Aleve, Motrin, and Ibuprofen products for 2 weeks following surgery as they can increase your risk of bleeding. 7. If you happen to look in the mirror or into your child's mouth you will see white/gray patches on the back of the throat. This is what a scab looks like in the mouth and is normal after having a tonsillectomy and adenoidectomy. They will disappear once the tonsil areas heal completely. However, it may cause a noticeable odor,  and this too will disappear with time.     8. You or your child may experience ear pain after having a tonsillectomy and adenoidectomy.  This is called referred pain and comes from the throat, but it is felt in the ears.  Ear pain is quite common and expected. It will usually go away after ten days. There is usually nothing wrong with the ears, and it is primarily due to the healing area stimulating the nerve to the ear that runs along the side of the throat. Use either the prescribed pain medicine or Tylenol (acetaminophen) as needed.  9. The throat tissues after a tonsillectomy are obviously sensitive. Smoking around children who have had a tonsillectomy significantly increases the risk of bleeding. DO NOT SMOKE!  What to Expect Each Day  First Day at Home 1. Patients will be discharged home the same day.  2. Drink at least four glasses of liquid a day. Clear, cool liquids are recommended. Fruit juices containing citric acid are not recommended because they tend to cause pain. Carbonated beverages are allowed if you pour them from glass to glass to remove the bubbles as these tend to cause discomfort. Avoid alcoholic beverages.  3. Eat very soft foods such as soups, broth, jello, custard, pudding, ice cream, popsicles, applesauce, mashed potatoes,   and in general anything that you can crush between your tongue and the roof of your mouth. Try adding [employer] Mix into your food for extra calories. It is not uncommon to lose 5 to 10 pounds of fluid weight. The weight will be gained back quickly once you're feeling better and drinking more.  4. Sleep with your head elevated on two pillows for about three days to help decrease the swelling.  5. DO NOT SMOKE!  Day Two  1. Rest as much as possible. Use common sense in your activities.  2. Continue drinking at least four glasses of liquid per day.  3. Follow the soft diet.  4. Use your pain medication as needed.  Day Three  1. Advance  your activity as you are able and continue to follow the previous day's suggestions.  Days Four Through Six  1. Advance your diet and begin to eat more solid foods such as chopped hamburger. 2. Advance your activities slowly. Children should be kept mostly around the house.  3. Not uncommonly, there will be more pain at this time. It is temporary, usually lasting a day or two.  Day Seven Through Ten  1. Most individuals by this time are able to return to work or school unless otherwise instructed. Consider sending children back to school for a half day on the first day back.  Scopolamine skin patches REMOVE PATCH IN 72 HOURS AND WASH HANDS IMMEDIATELY What is this medicine? SCOPOLAMINE (skoe POL a meen) is used to prevent nausea and vomiting caused by motion sickness, anesthesia and surgery. This medicine may be used for other purposes; ask your health care provider or pharmacist if you have questions. COMMON BRAND NAME(S): Transderm Scop What should I tell my health care provider before I take this medicine? They need to know if you have any of these conditions:  are scheduled to have a gastric secretion test  glaucoma  heart disease  kidney disease  liver disease  lung or breathing disease, like asthma  mental illness  prostate disease  seizures  stomach or intestine problems  trouble passing urine  an unusual or allergic reaction to scopolamine, atropine, other medicines, foods, dyes, or preservatives  pregnant or trying to get pregnant  breast-feeding How should I use this medicine? This medicine is for external use only. Follow the directions on the prescription label. Wear only 1 patch at a time. Choose an area behind the ear, that is clean, dry, hairless and free from any cuts or irritation. Wipe the area with a clean dry tissue. Peel off the plastic backing of the skin patch, trying not to touch the adhesive side with your hands. Do not cut the patches. Firmly  apply to the area you have chosen, with the metallic side of the patch to the skin and the tan-colored side showing. Once firmly in place, wash your hands well with soap and water. Do not get this medicine into your eyes. After removing the patch, wash your hands and the area behind your ear thoroughly with soap and water. The patch will still contain some medicine after use. To avoid accidental contact or ingestion by children or pets, fold the used patch in half with the sticky side together and throw away in the trash out of the reach of children and pets. If you need to use a second patch after you remove the first, place it behind the other ear. A special MedGuide will be given to you by the  pharmacist with each prescription and refill. Be sure to read this information carefully each time. Talk to your pediatrician regarding the use of this medicine in children. Special care may be needed. Overdosage: If you think you have taken too much of this medicine contact a poison control center or emergency room at once. NOTE: This medicine is only for you. Do not share this medicine with others. What if I miss a dose? This does not apply. This medicine is not for regular use. What may interact with this medicine?  alcohol  antihistamines for allergy cough and cold  atropine  certain medicines for anxiety or sleep  certain medicines for bladder problems like oxybutynin, tolterodine  certain medicines for depression like amitriptyline, fluoxetine, sertraline  certain medicines for stomach problems like dicyclomine, hyoscyamine  certain medicines for Parkinson's disease like benztropine, trihexyphenidyl  certain medicines for seizures like phenobarbital, primidone  general anesthetics like halothane, isoflurane, methoxyflurane, propofol  ipratropium  local anesthetics like lidocaine, pramoxine, tetracaine  medicines that relax muscles for surgery  phenothiazines like chlorpromazine,  mesoridazine, prochlorperazine, thioridazine  narcotic medicines for pain  other belladonna alkaloids This list may not describe all possible interactions. Give your health care provider a list of all the medicines, herbs, non-prescription drugs, or dietary supplements you use. Also tell them if you smoke, drink alcohol, or use illegal drugs. Some items may interact with your medicine. What should I watch for while using this medicine? Limit contact with water while swimming and bathing because the patch may fall off. If the patch falls off, throw it away and put a new one behind the other ear. You may get drowsy or dizzy. Do not drive, use machinery, or do anything that needs mental alertness until you know how this medicine affects you. Do not stand or sit up quickly, especially if you are an older patient. This reduces the risk of dizzy or fainting spells. Alcohol may interfere with the effect of this medicine. Avoid alcoholic drinks. Your mouth may get dry. Chewing sugarless gum or sucking hard candy, and drinking plenty of water may help. Contact your healthcare professional if the problem does not go away or is severe. This medicine may cause dry eyes and blurred vision. If you wear contact lenses, you may feel some discomfort. Lubricating drops may help. See your healthcare professional if the problem does not go away or is severe. If you are going to need surgery, an MRI, CT scan, or other procedure, tell your healthcare professional that you are using this medicine. You may need to remove the patch before the procedure. What side effects may I notice from receiving this medicine? Side effects that you should report to your doctor or health care professional as soon as possible:  allergic reactions like skin rash, itching or hives; swelling of the face, lips, or tongue  blurred vision  changes in vision  confusion  dizziness  eye pain  fast, irregular heartbeat  hallucinations,  loss of contact with reality  nausea, vomiting  pain or trouble passing urine  restlessness  seizures  skin irritation  stomach pain Side effects that usually do not require medical attention (report to your doctor or health care professional if they continue or are bothersome):  drowsiness  dry mouth  headache  sore throat This list may not describe all possible side effects. Call your doctor for medical advice about side effects. You may report side effects to FDA at 1-800-FDA-1088. Where should I keep my medicine? Keep   out of the reach of children. Store at room temperature between 20 and 25 degrees C (68 and 77 degrees F). Keep this medicine in the foil package until ready to use. Throw away any unused medicine after the expiration date. NOTE: This sheet is a summary. It may not cover all possible information. If you have questions about this medicine, talk to your doctor, pharmacist, or health care provider.  2020 Elsevier/Gold Standard (2017-08-13 16:14:46)  General Anesthesia, Adult, Care After This sheet gives you information about how to care for yourself after your procedure. Your health care provider may also give you more specific instructions. If you have problems or questions, contact your health care provider. What can I expect after the procedure? After the procedure, the following side effects are common:  Pain or discomfort at the IV site.  Nausea.  Vomiting.  Sore throat.  Trouble concentrating.  Feeling cold or chills.  Weak or tired.  Sleepiness and fatigue.  Soreness and body aches. These side effects can affect parts of the body that were not involved in surgery. Follow these instructions at home:  For at least 24 hours after the procedure:  Have a responsible adult stay with you. It is important to have someone help care for you until you are awake and alert.  Rest as needed.  Do not: ? Participate in activities in which you could  fall or become injured. ? Drive. ? Use heavy machinery. ? Drink alcohol. ? Take sleeping pills or medicines that cause drowsiness. ? Make important decisions or sign legal documents. ? Take care of children on your own. Eating and drinking  Follow any instructions from your health care provider about eating or drinking restrictions.  When you feel hungry, start by eating small amounts of foods that are soft and easy to digest (bland), such as toast. Gradually return to your regular diet.  Drink enough fluid to keep your urine pale yellow.  If you vomit, rehydrate by drinking water, juice, or clear broth. General instructions  If you have sleep apnea, surgery and certain medicines can increase your risk for breathing problems. Follow instructions from your health care provider about wearing your sleep device: ? Anytime you are sleeping, including during daytime naps. ? While taking prescription pain medicines, sleeping medicines, or medicines that make you drowsy.  Return to your normal activities as told by your health care provider. Ask your health care provider what activities are safe for you.  Take over-the-counter and prescription medicines only as told by your health care provider.  If you smoke, do not smoke without supervision.  Keep all follow-up visits as told by your health care provider. This is important. Contact a health care provider if:  You have nausea or vomiting that does not get better with medicine.  You cannot eat or drink without vomiting.  You have pain that does not get better with medicine.  You are unable to pass urine.  You develop a skin rash.  You have a fever.  You have redness around your IV site that gets worse. Get help right away if:  You have difficulty breathing.  You have chest pain.  You have blood in your urine or stool, or you vomit blood. Summary  After the procedure, it is common to have a sore throat or nausea. It is  also common to feel tired.  Have a responsible adult stay with you for the first 24 hours after general anesthesia. It is important to have  someone help care for you until you are awake and alert.  When you feel hungry, start by eating small amounts of foods that are soft and easy to digest (bland), such as toast. Gradually return to your regular diet.  Drink enough fluid to keep your urine pale yellow.  Return to your normal activities as told by your health care provider. Ask your health care provider what activities are safe for you. This information is not intended to replace advice given to you by your health care provider. Make sure you discuss any questions you have with your health care provider. Document Revised: 05/28/2017 Document Reviewed: 01/08/2017 Elsevier Patient Education  [place].

## 2020-06-06 NOTE — Anesthesia Preprocedure Evaluation (Signed)
Anesthesia Evaluation  Patient identified by MRN, date of birth, ID band Patient awake    Reviewed: Allergy & Precautions, H&P , NPO status , Patient's Chart, lab work & pertinent test results  History of Anesthesia Complications (+) PONV and history of anesthetic complications  Airway Mallampati: I  TM Distance: >3 FB Neck ROM: full    Dental  (+) Edentulous Upper   Pulmonary neg pulmonary ROS,    Pulmonary exam normal        Cardiovascular hypertension, On Medications Normal cardiovascular exam Rhythm:regular Rate:Normal     Neuro/Psych negative neurological ROS     GI/Hepatic negative GI ROS, Neg liver ROS,   Endo/Other  negative endocrine ROS  Renal/GU negative Renal ROS  negative genitourinary   Musculoskeletal   Abdominal   Peds  Hematology negative hematology ROS (+)   Anesthesia Other Findings   Reproductive/Obstetrics                             Anesthesia Physical Anesthesia Plan  ASA: II  Anesthesia Plan: General ETT   Post-op Pain Management:    Induction:   PONV Risk Score and Plan: 4 or greater and Ondansetron, Dexamethasone, Scopolamine patch - Pre-op and Treatment may vary due to age or medical condition  Airway Management Planned:   Additional Equipment:   Intra-op Plan:   Post-operative Plan:   Informed Consent: I have reviewed the patients History and Physical, chart, labs and discussed the procedure including the risks, benefits and alternatives for the proposed anesthesia with the patient or authorized representative who has indicated his/her understanding and acceptance.       Plan Discussed with:   Anesthesia Plan Comments:         Anesthesia Quick Evaluation

## 2020-06-06 NOTE — H&P (Signed)
H&P has been reviewed and patient reevaluated, no changes necessary. To be downloaded later.  

## 2020-06-06 NOTE — Op Note (Signed)
06/06/2020  9:31 AM    Monique Horne  [id number]   Pre-Op Dx: Enlarged tonsils causing obstruction, chronic tonsillitis  Post-op Dx: Same  Proc: Tonsillectomy  Surg:  Beverly Sessions Ameliana Brashear  Anes:  GOT  EBL: 30 mL  Comp: None  Findings: Very large tonsils on both sides that are cryptic with some debris  Procedure: Patient is brought to the operating room and placed in the supine position.  She is given general anesthesia by oral endotracheal intubation.  Once patient is asleep a Vernelle Emerald was used to visualize the oropharynx.  She is missing her upper anterior incisors.  The [employer] shows good oropharynx with very large tonsils on both sides.  These are cryptic with some debris in them.  The soft palate is retracted and the nasopharynx appears to be clear.  The left tonsil was grasped with a curved Allis and pulled medially.  The anterior pillar was incised with electrocautery.  The tonsil was dissected from its fossa with blunt dissection and electrocautery.  Bleeding was controlled with direct pressure and electrocautery.  The procedure was repeated on the right side.  The tonsil was grasped and pulled medially and the anterior pillar incised.  The tonsil was dissected from its fossa with blunt dissection and electrocautery.  Bleeding was controlled with direct pressure and electrocautery.  There is no significant bleeding.  Patient tolerated procedure well.  She was awakened and taken to the recovery room in satisfactory condition.  There were no operative complications.  Dispo:   To PACU to be discharged home  Plan: To follow-up in 2 to 3 weeks to make sure she is healed well.  She will push liquids at home and slowly increase to a soft diet as tolerated.  She is given Tylenol with hydrocodone liquid to use for pain and can supplement with some Tylenol.  She will call the office if she has any challenges in the postop.  Beverly Sessions [name]  06/06/2020 9:31 AM

## 2020-06-11 LAB — SURGICAL PATHOLOGY
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
## 2020-12-07 ENCOUNTER — Ambulatory Visit
Admission: EM | Admit: 2020-12-07 | Discharge: 2020-12-07 | Disposition: A | Discharge status: Home / Self Care | Payer: Medicaid Other | Attending: Emergency Medicine | Admitting: Emergency Medicine

## 2020-12-07 ENCOUNTER — Ambulatory Visit (HOSPITAL_COMMUNITY)
Admission: EM | Admit: 2020-12-07 | Discharge: 2020-12-07 | Discharge status: Absent without leave | Payer: Medicaid Other

## 2020-12-07 ENCOUNTER — Other Ambulatory Visit: Payer: Self-pay

## 2020-12-07 ENCOUNTER — Telehealth: Payer: Self-pay

## 2020-12-07 DIAGNOSIS — U071 COVID-19: Secondary | ICD-10-CM | POA: Diagnosis present

## 2020-12-07 LAB — POC SARS CORONAVIRUS 2 AG: SARSCOV2ONAVIRUS 2 AG: POSITIVE — AB

## 2020-12-07 MED ORDER — BENZONATATE 100 MG PO CAPS: 200.0000 mg | ORAL_CAPSULE | Freq: Three times a day (TID) | ORAL | 0 refills | Status: AC

## 2020-12-07 MED ORDER — NIRMATRELVIR/RITONAVIR (PAXLOVID)TABLET: 3.0000 | ORAL_TABLET | Freq: Two times a day (BID) | ORAL | 0 refills | Status: AC

## 2020-12-07 MED ORDER — NIRMATRELVIR/RITONAVIR (PAXLOVID)TABLET: 3.0000 | ORAL_TABLET | Freq: Two times a day (BID) | ORAL | 0 refills | Status: DC

## 2020-12-07 MED ORDER — IPRATROPIUM BROMIDE 0.06 % NA SOLN: 2.0000 | Freq: Four times a day (QID) | NASAL | 12 refills | Status: AC

## 2020-12-07 MED ORDER — PROMETHAZINE-DM 6.25-15 MG/5ML PO SYRP: 5.0000 mL | ORAL_SOLUTION | Freq: Four times a day (QID) | ORAL | 0 refills | Status: AC | PRN

## 2020-12-07 NOTE — Discharge Instructions (Addendum)
You will have to quarantine for 5 days from the start of your symptoms.  After 5 days you can break quarantine if your symptoms have improved and you have not had a fever for 24 hours without taking Tylenol or ibuprofen.  Use over-the-counter Tylenol and ibuprofen as needed for body aches and fever.  Take the Paxlovid twice daily for 5 days for treatment of COVID-19.  Use the Tessalon Perles during the day as needed for cough and the Promethazine DM cough syrup at nighttime as will make you drowsy.  If you develop any increased shortness of breath-especially at rest, you are unable to speak in full sentences, or is a late sign your lips are turning blue you need to go the ER for evaluation.

## 2020-12-07 NOTE — ED Provider Notes (Signed)
[facility]    CSN: [id number] Arrival date & time: 12/07/20  0808      History   Chief Complaint Chief Complaint  Patient presents with   Cough   Headache    HPI Monique Horne is a 40 y.o. female.   HPI  40 year old female here for evaluation of respiratory symptoms.  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  She denies nausea or vomiting.  She has had her COVID-vaccine plus her booster.  She was tested at work and reports that the line was faintly positive so they sent her to the urgent care for confirmation.  Patient is not in any acute distress and can speak in full sentences.  Patient does have a history of asthma, hypertension, and obesity.  Past Medical History:  Diagnosis Date   Anxiety    Hypertension    Motion sickness    Panic attacks    PONV (postoperative nausea and vomiting)    Wears contact lenses    Wears dentures    partial lower    Patient Active Problem List   Diagnosis Date Noted   Admission for sterilization 11/28/2015    Past Surgical History:  Procedure Laterality Date   CESAREAN SECTION     x 2   LAPAROSCOPIC TUBAL LIGATION Bilateral 11/28/2015   Procedure: LAPAROSCOPIC TUBAL LIGATION;  Surgeon: Conard Novak, MD;  Location: [facility];  Service: Gynecology;  Laterality: Bilateral;   TONSILLECTOMY Bilateral 06/06/2020   Procedure: TONSILLECTOMY;  Surgeon: Vernie Murders, MD;  Location: [facility];  Service: ENT;  Laterality: Bilateral;    OB History   No obstetric history on file.      Home Medications    Prior to Admission medications   Medication Sig Start Date End Date Taking? Authorizing Provider  acyclovir (ZOVIRAX) 400 MG tablet Take 400 mg by mouth 2 (two) times daily. 11/26/20  Yes [provider]  albuterol  (PROAIR HFA) 108 (90 Base) MCG/ACT inhaler ProAir HFA 90 mcg/actuation aerosol inhaler  INHALE 2 INHALATIONS INTO THE LUNGS EVERY 6 (SIX) HOURS AS NEEDED FOR WHEEZING FOR UP TO 30 DAYS 12/04/19  Yes [provider]  ALPRAZolam (XANAX) 0.25 MG tablet Take 0.25 mg by mouth 3 (three) times daily as needed for anxiety.   Yes [provider]  amphetamine-dextroamphetamine (ADDERALL) 20 MG tablet Take 20 mg by mouth daily.   Yes [provider]  benzonatate (TESSALON) 100 MG capsule Take 2 capsules (200 mg total) by mouth every 8 (eight) hours. 12/07/20  Yes Becky Augusta, NP  escitalopram (LEXAPRO) 20 MG tablet Take 20 mg by mouth daily.   Yes [provider]  hydrochlorothiazide (HYDRODIURIL) 25 MG tablet Take 25 mg by mouth daily.   Yes [provider]  ipratropium (ATROVENT) 0.06 % nasal spray Place 2 sprays into both nostrils 4 (four) times daily. 12/07/20  Yes Becky Augusta, NP  Multiple Vitamin (MULTIVITAMIN) tablet Take 1 tablet by mouth daily.   Yes [provider]  nirmatrelvir/ritonavir EUA (PAXLOVID) TABS Take 3 tablets by mouth 2 (two) times daily for 5 days. Patient GFR is >90. Take nirmatrelvir (150 mg) two tablets twice daily for 5 days and ritonavir (100 mg) one tablet twice daily for 5 days. 12/07/20 12/12/20 Yes Becky Augusta, NP  pramipexole (MIRAPEX) 0.125 MG tablet Take 0.125 mg  by mouth at bedtime as needed. 10/26/20  Yes [provider]  promethazine-dextromethorphan (PROMETHAZINE-DM) 6.25-15 MG/5ML syrup Take 5 mLs by mouth 4 (four) times daily as needed. 12/07/20  Yes Becky Augustayan, Nyjah Schwake, NP  valsartan (DIOVAN) 80 MG tablet Take 80 mg by mouth daily.   Yes [provider]  cetirizine (ZYRTEC) 10 MG tablet Take 10 mg by mouth daily.    [provider]    Family History Family History  Problem Relation Age of Onset   Healthy Mother     Social History Social History   Tobacco Use   Smoking status: Never    Smokeless tobacco: Never  Vaping Use   Vaping Use: Never used  Substance Use Topics   Alcohol use: No   Drug use: No     Allergies   Amoxicillin, Effexor [venlafaxine], and Penicillins   Review of Systems Review of Systems  Constitutional:  Positive for fever. Negative for activity change and appetite change.  HENT:  Positive for congestion, rhinorrhea and sinus pressure.   Respiratory:  Positive for cough, shortness of breath and wheezing.   Gastrointestinal:  Positive for diarrhea. Negative for nausea and vomiting.  Musculoskeletal:  Positive for arthralgias and myalgias.  Skin:  Negative for rash.  Neurological:  Positive for headaches.  Hematological: Negative.   Psychiatric/Behavioral: Negative.      Physical Exam Triage Vital Signs ED Triage Vitals [12/07/20 0835]  Enc Vitals Group     BP      Pulse      Resp      Temp      Temp src      SpO2      Weight 260 lb (117.9 kg)     Height 5\' 3"  (1.6 m)     Head Circumference      Peak Flow      Pain Score 3     Pain Loc      Pain Edu?      Excl. in GC?    No data found.  Updated Vital Signs BP (!) 141/104 (BP Location: Left Arm)   Pulse 91   Temp 99.8 F (37.7 C) (Oral)   Resp 18   Ht 5\' 3"  (1.6 m)   Wt 260 lb (117.9 kg)   LMP 11/23/2020   SpO2 98%   BMI 46.06 kg/m   Visual Acuity Right Eye Distance:   Left Eye Distance:   Bilateral Distance:    Right Eye Near:   Left Eye Near:    Bilateral Near:     Physical Exam Constitutional:      Appearance: Normal appearance. She is obese. She is not ill-appearing.  HENT:     Head: Normocephalic and atraumatic.     Right Ear: Tympanic membrane, ear canal and external ear normal. There is no impacted cerumen.     Left Ear: Tympanic membrane, ear canal and external ear normal. There is no impacted cerumen.     Nose: Congestion and rhinorrhea present.     Mouth/Throat:     Mouth: Mucous membranes are moist.     Pharynx: Oropharynx is clear. Posterior  oropharyngeal erythema present.  Cardiovascular:     Rate and Rhythm: Normal rate and regular rhythm.     Pulses: Normal pulses.     Heart sounds: Normal heart sounds. No murmur heard.   No gallop.  Pulmonary:     Effort: Pulmonary effort is normal.     Breath sounds: Normal breath sounds. No  wheezing, rhonchi or rales.  Musculoskeletal:     Cervical back: Normal range of motion and neck supple.  Lymphadenopathy:     Cervical: No cervical adenopathy.  Skin:    General: Skin is warm and dry.     Capillary Refill: Capillary refill takes less than 2 seconds.     Findings: No erythema or rash.  Neurological:     General: No focal deficit present.     Mental Status: She is alert and oriented to person, place, and time.  Psychiatric:        Mood and Affect: Mood normal.        Behavior: Behavior normal.        Thought Content: Thought content normal.        Judgment: Judgment normal.     UC Treatments / Results  Labs (all labs ordered are listed, but only abnormal results are displayed) Labs Reviewed  POC SARS CORONAVIRUS 2 AG - Abnormal; Notable for the following components:      Result Value   SARSCOV2ONAVIRUS 2 AG POSITIVE (*)    All other components within normal limits  SARS CORONAVIRUS 2 (TAT 6-24 HRS)  POC SARS CORONAVIRUS 2 AG -  ED    EKG   Radiology No results found.  Procedures Procedures (including critical care time)  Medications Ordered in UC Medications - No data to display  Initial Impression / Assessment and Plan / UC Course  I have reviewed the triage vital signs and the nursing notes.  Pertinent labs & imaging results that were available during my care of the patient were reviewed by me and considered in my medical decision making (see chart for details).  Patient is a very pleasant and nontoxic-appearing 40 year old female here for evaluation of respiratory complaints as outlined in HPI above.  Patient's physical exam reveals pearly gray tympanic  membranes bilaterally with a normal light reflex and clear external auditory canals.  Nasal mucosa is mildly erythematous edematous with clear nasal discharge.  Oropharyngeal exam reveals posterior oropharyngeal erythema with clear postnasal drip.  No cervical lymphadenopathy on exam.  Cardiopulmonary exam is benign.  We will send COVID PCR confirmation for her positive test at work and also perform a rapid COVID here.  Intend to treat with antiviral therapy, Atrovent nasal spray, Tessalon Perles, and Promethazine DM cough syrup for symptom relief.  Rapid COVID test is positive.  With patient having history of obesity, asthma, and hypertension will treat with Paxlovid and will add on ipratropium nasal spray to help with nasal congestion, runny nose, postnasal drip.  We will also give Tessalon Perles and Promethazine DM for cough and congestion relief.  ER and return precautions reviewed with patient.   Final Clinical Impressions(s) / UC Diagnoses   Final diagnoses:  COVID-19     Discharge Instructions      You will have to quarantine for 5 days from the start of your symptoms.  After 5 days you can break quarantine if your symptoms have improved and you have not had a fever for 24 hours without taking Tylenol or ibuprofen.  Use over-the-counter Tylenol and ibuprofen as needed for body aches and fever.  Take the Paxlovid twice daily for 5 days for treatment of COVID-19.  Use the Tessalon Perles during the day as needed for cough and the Promethazine DM cough syrup at nighttime as will make you drowsy.  If you develop any increased shortness of breath-especially at rest, you are unable to speak in full  sentences, or is a late sign your lips are turning blue you need to go the ER for evaluation.      ED Prescriptions     Medication Sig Dispense Auth. Provider   nirmatrelvir/ritonavir EUA (PAXLOVID) TABS Take 3 tablets by mouth 2 (two) times daily for 5 days. Patient GFR is >90. Take  nirmatrelvir (150 mg) two tablets twice daily for 5 days and ritonavir (100 mg) one tablet twice daily for 5 days. 30 tablet Becky Augusta, NP   benzonatate (TESSALON) 100 MG capsule Take 2 capsules (200 mg total) by mouth every 8 (eight) hours. 21 capsule Becky Augusta, NP   ipratropium (ATROVENT) 0.06 % nasal spray Place 2 sprays into both nostrils 4 (four) times daily. 15 mL Becky Augusta, NP   promethazine-dextromethorphan (PROMETHAZINE-DM) 6.25-15 MG/5ML syrup Take 5 mLs by mouth 4 (four) times daily as needed. 118 mL Becky Augusta, NP      PDMP not reviewed this encounter.   Becky Augusta, NP 12/07/20 [phone]

## 2020-12-07 NOTE — ED Triage Notes (Signed)
Pt c/o fever (101-103) yesterday, chills, diarrhea, headache, nasal congestion, cough and sneezing. Pt states she started feeling bad Thursday. Pt is asking for a COVID test.

## 2020-12-08 LAB — SARS CORONAVIRUS 2 (TAT 6-24 HRS): SARS Coronavirus 2: POSITIVE — AB

## 2022-06-17 ENCOUNTER — Ambulatory Visit: Payer: Medicaid Other | Admitting: Physician Assistant

## 2023-05-16 ENCOUNTER — Telehealth: Payer: Medicaid Other | Admitting: Family Medicine

## 2023-05-16 DIAGNOSIS — J302 Other seasonal allergic rhinitis: Secondary | ICD-10-CM | POA: Diagnosis not present

## 2023-05-16 DIAGNOSIS — H5789 Other specified disorders of eye and adnexa: Secondary | ICD-10-CM | POA: Diagnosis not present

## 2023-05-16 MED ORDER — PREDNISONE 20 MG PO TABS: 20.0000 mg | ORAL_TABLET | Freq: Two times a day (BID) | ORAL | 0 refills | Status: AC

## 2023-05-16 NOTE — Patient Instructions (Signed)

## 2023-05-16 NOTE — Progress Notes (Signed)
Virtual Visit Consent   Monique Horne, you are scheduled for a virtual visit with a [facility] provider today. Just as with appointments in the office, your consent must be obtained to participate. Your consent will be active for this visit and any virtual visit you may have with one of our providers in the next 365 days. If you have a MyChart account, a copy of this consent can be sent to you electronically.  As this is a virtual visit, video technology does not allow for your provider to perform a traditional examination. This may limit your provider's ability to fully assess your condition. If your provider identifies any concerns that need to be evaluated in person or the need to arrange testing (such as labs, EKG, etc.), we will make arrangements to do so. Although advances in technology are sophisticated, we cannot ensure that it will always work on either your end or our end. If the connection with a video visit is poor, the visit may have to be switched to a telephone visit. With either a video or telephone visit, we are not always able to ensure that we have a secure connection.  By engaging in this virtual visit, you consent to the provision of healthcare and authorize for your insurance to be billed (if applicable) for the services provided during this visit. Depending on your insurance coverage, you may receive a charge related to this service.  I need to obtain your verbal consent now. Are you willing to proceed with your visit today? Monique Horne has provided verbal consent on 05/16/2023 for a virtual visit (video or telephone). Georgana Curio, FNP  Date: 05/16/2023 6:18 PM  Virtual Visit via Video Note   I, Georgana Curio, connected with  Monique Horne  ([id number], 02/19/1981) on 05/16/23 at  6:15 PM EST by a video-enabled telemedicine application and verified that I am speaking with the correct person using two identifiers.  Location: Patient: Virtual Visit Location Patient: Home Provider:  Virtual Visit Location Provider: Home Office   I discussed the limitations of evaluation and management by telemedicine and the availability of in person appointments. The patient expressed understanding and agreed to proceed.    History of Present Illness: Monique Horne is a 42 y.o. who identifies as a female who was assigned female at birth, and is being seen today for swollen itching eyes, on zyrtec, no drainage, no matting. She feels her allergies are affecting her eyes. Marland Kitchen  HPI: HPI  Problems:  Patient Active Problem List   Diagnosis Date Noted   Admission for sterilization 11/28/2015    Allergies:  Allergies  Allergen Reactions   Amoxicillin Rash    Has patient had a PCN reaction causing immediate rash, facial/tongue/throat swelling, SOB or lightheadedness with hypotension: Yes Has patient had a PCN reaction causing severe rash involving mucus membranes or skin necrosis: Yes Has patient had a PCN reaction that required hospitalization Yes Has patient had a PCN reaction occurring within the last 10 years: No If all of the above answers are "NO", then may proceed with Cephalosporin use.    Effexor [Venlafaxine] Palpitations   Penicillins Rash    Has patient had a PCN reaction causing immediate rash, facial/tongue/throat swelling, SOB or lightheadedness with hypotension: Yes Has patient had a PCN reaction causing severe rash involving mucus membranes or skin necrosis: Yes Has patient had a PCN reaction that required hospitalization Yes Has patient had a PCN reaction occurring within the last 10 years: No If all of the  above answers are "NO", then may proceed with Cephalosporin use.    Medications:  Current Outpatient Medications:    acyclovir (ZOVIRAX) 400 MG tablet, Take 400 mg by mouth 2 (two) times daily., Disp: , Rfl:    albuterol (PROAIR HFA) 108 (90 Base) MCG/ACT inhaler, ProAir HFA 90 mcg/actuation aerosol inhaler  INHALE 2 INHALATIONS INTO THE LUNGS EVERY 6 (SIX) HOURS AS  NEEDED FOR WHEEZING FOR UP TO 30 DAYS, Disp: , Rfl:    ALPRAZolam (XANAX) 0.25 MG tablet, Take 0.25 mg by mouth 3 (three) times daily as needed for anxiety., Disp: , Rfl:    amphetamine-dextroamphetamine (ADDERALL) 20 MG tablet, Take 20 mg by mouth daily., Disp: , Rfl:    benzonatate (TESSALON) 100 MG capsule, Take 2 capsules (200 mg total) by mouth every 8 (eight) hours., Disp: 21 capsule, Rfl: 0   cetirizine (ZYRTEC) 10 MG tablet, Take 10 mg by mouth daily., Disp: , Rfl:    escitalopram (LEXAPRO) 20 MG tablet, Take 20 mg by mouth daily., Disp: , Rfl:    hydrochlorothiazide (HYDRODIURIL) 25 MG tablet, Take 25 mg by mouth daily., Disp: , Rfl:    ipratropium (ATROVENT) 0.06 % nasal spray, Place 2 sprays into both nostrils 4 (four) times daily., Disp: 15 mL, Rfl: 12   Multiple Vitamin (MULTIVITAMIN) tablet, Take 1 tablet by mouth daily., Disp: , Rfl:    pramipexole (MIRAPEX) 0.125 MG tablet, Take 0.125 mg by mouth at bedtime as needed., Disp: , Rfl:    promethazine-dextromethorphan (PROMETHAZINE-DM) 6.25-15 MG/5ML syrup, Take 5 mLs by mouth 4 (four) times daily as needed., Disp: 118 mL, Rfl: 0   valsartan (DIOVAN) 80 MG tablet, Take 80 mg by mouth daily., Disp: , Rfl:   Observations/Objective: Patient is well-developed, well-nourished in no acute distress.  Resting comfortably  at home.  Head is normocephalic, atraumatic.  No labored breathing.  Speech is clear and coherent with logical content.  Patient is alert and oriented at baseline.    Assessment and Plan: 1. Seasonal allergies  2. Eyes swollen  Cool compresses, continue zyrtec, urgent care if sx worsen.   Follow Up Instructions: I discussed the assessment and treatment plan with the patient. The patient was provided an opportunity to ask questions and all were answered. The patient agreed with the plan and demonstrated an understanding of the instructions.  A copy of instructions were sent to the patient via MyChart unless  otherwise noted below.     The patient was advised to call back or seek an in-person evaluation if the symptoms worsen or if the condition fails to improve as anticipated.    Georgana Curio, FNP
